# Patient Record
Sex: Female | Born: 1940 | Race: White | Hispanic: No | Smoking: Former smoker
Health system: Southern US, Community
[De-identification: ages and names within clinical notes are randomized; demographics above are authoritative.]

## PROBLEM LIST (undated history)

## (undated) DIAGNOSIS — K219 Gastro-esophageal reflux disease without esophagitis: Secondary | ICD-10-CM

## (undated) DIAGNOSIS — J449 Chronic obstructive pulmonary disease, unspecified: Secondary | ICD-10-CM

## (undated) DIAGNOSIS — R251 Tremor, unspecified: Secondary | ICD-10-CM

## (undated) DIAGNOSIS — F329 Major depressive disorder, single episode, unspecified: Secondary | ICD-10-CM

## (undated) DIAGNOSIS — E079 Disorder of thyroid, unspecified: Secondary | ICD-10-CM

## (undated) DIAGNOSIS — E785 Hyperlipidemia, unspecified: Secondary | ICD-10-CM

## (undated) DIAGNOSIS — F32A Depression, unspecified: Secondary | ICD-10-CM

## (undated) DIAGNOSIS — E039 Hypothyroidism, unspecified: Secondary | ICD-10-CM

## (undated) DIAGNOSIS — I10 Essential (primary) hypertension: Secondary | ICD-10-CM

## (undated) DIAGNOSIS — Q254 Congenital malformation of aorta unspecified: Secondary | ICD-10-CM

## (undated) HISTORY — DX: Chronic obstructive pulmonary disease, unspecified: J44.9

## (undated) HISTORY — PX: BREAST SURGERY: SHX581

## (undated) HISTORY — DX: Essential (primary) hypertension: I10

## (undated) HISTORY — DX: Gastro-esophageal reflux disease without esophagitis: K21.9

## (undated) HISTORY — PX: VARICOSE VEIN SURGERY: SHX832

## (undated) HISTORY — DX: Disorder of thyroid, unspecified: E07.9

## (undated) HISTORY — DX: Hyperlipidemia, unspecified: E78.5

---
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
## 1978-08-10 HISTORY — PX: OTHER SURGICAL HISTORY: SHX169

## 1989-08-10 HISTORY — PX: HERNIA REPAIR: SHX51

## 2004-10-27 ENCOUNTER — Ambulatory Visit: Payer: Self-pay | Admitting: Family Medicine

## 2005-11-13 ENCOUNTER — Emergency Department: Payer: Self-pay | Admitting: Emergency Medicine

## 2006-03-29 ENCOUNTER — Ambulatory Visit: Payer: Self-pay | Admitting: Family Medicine

## 2006-05-31 ENCOUNTER — Inpatient Hospital Stay: Payer: Self-pay | Admitting: Internal Medicine

## 2006-05-31 ENCOUNTER — Other Ambulatory Visit: Payer: Self-pay

## 2007-04-04 ENCOUNTER — Ambulatory Visit: Payer: Self-pay | Admitting: Family Medicine

## 2008-05-24 ENCOUNTER — Ambulatory Visit: Payer: Self-pay | Admitting: Family Medicine

## 2008-08-10 LAB — HM COLONOSCOPY

## 2009-07-01 ENCOUNTER — Ambulatory Visit: Payer: Self-pay | Admitting: Family Medicine

## 2010-08-10 LAB — HM MAMMOGRAPHY: HM Mammogram: NORMAL

## 2010-08-14 ENCOUNTER — Ambulatory Visit: Payer: Self-pay | Admitting: Family Medicine

## 2011-02-08 ENCOUNTER — Ambulatory Visit: Payer: Self-pay | Admitting: Internal Medicine

## 2011-02-17 ENCOUNTER — Ambulatory Visit: Payer: Self-pay | Admitting: Internal Medicine

## 2011-03-11 ENCOUNTER — Ambulatory Visit: Payer: Self-pay | Admitting: Internal Medicine

## 2011-04-08 ENCOUNTER — Ambulatory Visit: Payer: Self-pay | Admitting: Internal Medicine

## 2011-04-11 ENCOUNTER — Ambulatory Visit: Payer: Self-pay | Admitting: Internal Medicine

## 2011-12-25 ENCOUNTER — Ambulatory Visit: Payer: Self-pay

## 2012-01-28 ENCOUNTER — Ambulatory Visit: Payer: Self-pay | Admitting: Cardiology

## 2013-10-14 LAB — TSH: TSH: 0.94 u[IU]/mL (ref <=5.90)

## 2014-06-14 LAB — BASIC METABOLIC PANEL
Creatinine: 0.7 mg/dL (ref <=1.1)
Glucose: 97 mg/dL

## 2014-08-21 DIAGNOSIS — J449 Chronic obstructive pulmonary disease, unspecified: Secondary | ICD-10-CM | POA: Diagnosis not present

## 2014-08-29 DIAGNOSIS — R05 Cough: Secondary | ICD-10-CM | POA: Diagnosis not present

## 2014-08-29 DIAGNOSIS — R0602 Shortness of breath: Secondary | ICD-10-CM | POA: Diagnosis not present

## 2014-08-29 DIAGNOSIS — J449 Chronic obstructive pulmonary disease, unspecified: Secondary | ICD-10-CM | POA: Diagnosis not present

## 2014-08-29 DIAGNOSIS — J3489 Other specified disorders of nose and nasal sinuses: Secondary | ICD-10-CM | POA: Diagnosis not present

## 2014-09-21 DIAGNOSIS — J449 Chronic obstructive pulmonary disease, unspecified: Secondary | ICD-10-CM | POA: Diagnosis not present

## 2014-10-20 DIAGNOSIS — J449 Chronic obstructive pulmonary disease, unspecified: Secondary | ICD-10-CM | POA: Diagnosis not present

## 2014-11-20 DIAGNOSIS — J449 Chronic obstructive pulmonary disease, unspecified: Secondary | ICD-10-CM | POA: Diagnosis not present

## 2014-11-30 DIAGNOSIS — J449 Chronic obstructive pulmonary disease, unspecified: Secondary | ICD-10-CM | POA: Insufficient documentation

## 2014-11-30 DIAGNOSIS — F32A Depression, unspecified: Secondary | ICD-10-CM | POA: Insufficient documentation

## 2014-11-30 DIAGNOSIS — F329 Major depressive disorder, single episode, unspecified: Secondary | ICD-10-CM | POA: Insufficient documentation

## 2014-11-30 DIAGNOSIS — K219 Gastro-esophageal reflux disease without esophagitis: Secondary | ICD-10-CM | POA: Insufficient documentation

## 2014-11-30 DIAGNOSIS — E039 Hypothyroidism, unspecified: Secondary | ICD-10-CM | POA: Insufficient documentation

## 2014-11-30 DIAGNOSIS — I10 Essential (primary) hypertension: Secondary | ICD-10-CM | POA: Insufficient documentation

## 2014-12-18 DIAGNOSIS — E039 Hypothyroidism, unspecified: Secondary | ICD-10-CM | POA: Diagnosis not present

## 2014-12-18 DIAGNOSIS — Z1389 Encounter for screening for other disorder: Secondary | ICD-10-CM | POA: Diagnosis not present

## 2014-12-18 DIAGNOSIS — Z0000 Encounter for general adult medical examination without abnormal findings: Secondary | ICD-10-CM | POA: Diagnosis not present

## 2014-12-18 DIAGNOSIS — F329 Major depressive disorder, single episode, unspecified: Secondary | ICD-10-CM | POA: Diagnosis not present

## 2014-12-20 DIAGNOSIS — J449 Chronic obstructive pulmonary disease, unspecified: Secondary | ICD-10-CM | POA: Diagnosis not present

## 2015-01-15 DIAGNOSIS — I359 Nonrheumatic aortic valve disorder, unspecified: Secondary | ICD-10-CM | POA: Diagnosis not present

## 2015-01-15 DIAGNOSIS — I10 Essential (primary) hypertension: Secondary | ICD-10-CM | POA: Diagnosis not present

## 2015-01-15 DIAGNOSIS — Z8679 Personal history of other diseases of the circulatory system: Secondary | ICD-10-CM | POA: Diagnosis not present

## 2015-01-15 DIAGNOSIS — J42 Unspecified chronic bronchitis: Secondary | ICD-10-CM | POA: Diagnosis not present

## 2015-01-20 DIAGNOSIS — J449 Chronic obstructive pulmonary disease, unspecified: Secondary | ICD-10-CM | POA: Diagnosis not present

## 2015-01-23 DIAGNOSIS — R0609 Other forms of dyspnea: Secondary | ICD-10-CM | POA: Diagnosis not present

## 2015-01-23 DIAGNOSIS — J439 Emphysema, unspecified: Secondary | ICD-10-CM | POA: Diagnosis not present

## 2015-01-23 DIAGNOSIS — R0902 Hypoxemia: Secondary | ICD-10-CM | POA: Diagnosis not present

## 2015-02-13 ENCOUNTER — Other Ambulatory Visit: Payer: Medicare Other

## 2015-02-13 DIAGNOSIS — E039 Hypothyroidism, unspecified: Secondary | ICD-10-CM

## 2015-02-14 ENCOUNTER — Other Ambulatory Visit: Payer: Self-pay

## 2015-02-14 DIAGNOSIS — E039 Hypothyroidism, unspecified: Secondary | ICD-10-CM

## 2015-02-14 LAB — TSH: TSH: 2.24 u[IU]/mL (ref 0.450–4.500)

## 2015-02-14 MED ORDER — LEVOTHYROXINE SODIUM 88 MCG PO TABS
88.0000 ug | ORAL_TABLET | Freq: Every day | ORAL | Status: DC
Start: 2015-02-14 — End: 2015-07-26

## 2015-02-19 DIAGNOSIS — J449 Chronic obstructive pulmonary disease, unspecified: Secondary | ICD-10-CM | POA: Diagnosis not present

## 2015-03-22 DIAGNOSIS — J449 Chronic obstructive pulmonary disease, unspecified: Secondary | ICD-10-CM | POA: Diagnosis not present

## 2015-04-22 DIAGNOSIS — J449 Chronic obstructive pulmonary disease, unspecified: Secondary | ICD-10-CM | POA: Diagnosis not present

## 2015-05-17 ENCOUNTER — Other Ambulatory Visit: Payer: Self-pay

## 2015-06-22 DIAGNOSIS — J449 Chronic obstructive pulmonary disease, unspecified: Secondary | ICD-10-CM | POA: Diagnosis not present

## 2015-06-24 DIAGNOSIS — R0902 Hypoxemia: Secondary | ICD-10-CM | POA: Diagnosis not present

## 2015-06-24 DIAGNOSIS — R0609 Other forms of dyspnea: Secondary | ICD-10-CM | POA: Diagnosis not present

## 2015-06-24 DIAGNOSIS — J439 Emphysema, unspecified: Secondary | ICD-10-CM | POA: Diagnosis not present

## 2015-07-22 DIAGNOSIS — J449 Chronic obstructive pulmonary disease, unspecified: Secondary | ICD-10-CM | POA: Diagnosis not present

## 2015-07-26 ENCOUNTER — Other Ambulatory Visit: Payer: Self-pay

## 2015-07-26 DIAGNOSIS — F32A Depression, unspecified: Secondary | ICD-10-CM

## 2015-07-26 DIAGNOSIS — F329 Major depressive disorder, single episode, unspecified: Secondary | ICD-10-CM

## 2015-07-26 DIAGNOSIS — E039 Hypothyroidism, unspecified: Secondary | ICD-10-CM

## 2015-07-26 DIAGNOSIS — K219 Gastro-esophageal reflux disease without esophagitis: Secondary | ICD-10-CM

## 2015-07-26 MED ORDER — LEVOTHYROXINE SODIUM 88 MCG PO TABS: 88.0000 ug | ORAL_TABLET | Freq: Every day | ORAL | Status: DC

## 2015-07-26 MED ORDER — OMEPRAZOLE 20 MG PO CPDR: 20.0000 mg | DELAYED_RELEASE_CAPSULE | Freq: Every day | ORAL | Status: DC

## 2015-07-26 MED ORDER — CITALOPRAM HYDROBROMIDE 20 MG PO TABS: 20.0000 mg | ORAL_TABLET | Freq: Every day | ORAL | Status: DC

## 2015-07-29 ENCOUNTER — Other Ambulatory Visit: Payer: Self-pay

## 2015-07-30 DIAGNOSIS — J439 Emphysema, unspecified: Secondary | ICD-10-CM | POA: Diagnosis not present

## 2015-07-30 DIAGNOSIS — K219 Gastro-esophageal reflux disease without esophagitis: Secondary | ICD-10-CM | POA: Diagnosis not present

## 2015-07-30 DIAGNOSIS — I10 Essential (primary) hypertension: Secondary | ICD-10-CM | POA: Diagnosis not present

## 2015-07-30 DIAGNOSIS — I359 Nonrheumatic aortic valve disorder, unspecified: Secondary | ICD-10-CM | POA: Diagnosis not present

## 2015-07-30 DIAGNOSIS — R0602 Shortness of breath: Secondary | ICD-10-CM | POA: Diagnosis not present

## 2015-08-01 ENCOUNTER — Encounter: Payer: Self-pay | Admitting: Family Medicine

## 2015-08-01 ENCOUNTER — Ambulatory Visit (INDEPENDENT_AMBULATORY_CARE_PROVIDER_SITE_OTHER): Payer: Medicare Other | Admitting: Family Medicine

## 2015-08-01 VITALS — BP 120/70 | HR 80 | Ht 64.0 in | Wt 115.0 lb

## 2015-08-01 DIAGNOSIS — K219 Gastro-esophageal reflux disease without esophagitis: Secondary | ICD-10-CM

## 2015-08-01 DIAGNOSIS — F329 Major depressive disorder, single episode, unspecified: Secondary | ICD-10-CM | POA: Diagnosis not present

## 2015-08-01 DIAGNOSIS — E039 Hypothyroidism, unspecified: Secondary | ICD-10-CM

## 2015-08-01 DIAGNOSIS — F32A Depression, unspecified: Secondary | ICD-10-CM

## 2015-08-01 MED ORDER — OMEPRAZOLE 20 MG PO CPDR: 20.0000 mg | DELAYED_RELEASE_CAPSULE | Freq: Every day | ORAL | Status: DC

## 2015-08-01 MED ORDER — LEVOTHYROXINE SODIUM 88 MCG PO TABS: 88.0000 ug | ORAL_TABLET | Freq: Every day | ORAL | Status: DC

## 2015-08-01 MED ORDER — CITALOPRAM HYDROBROMIDE 20 MG PO TABS: 20.0000 mg | ORAL_TABLET | Freq: Every day | ORAL | Status: DC

## 2015-08-01 NOTE — Progress Notes (Signed)
Name: Tasha Howe   MRN: [id number]    DOB: 1941/02/14   Date:08/01/2015       Progress Note  Subjective  Chief Complaint  Chief Complaint  Patient presents with  . Hypothyroidism  . Depression  . Gastroesophageal Reflux    Depression        This is a chronic problem.  The current episode started more than 1 year ago.   The onset quality is gradual.   The problem occurs intermittently.  The problem has been waxing and waning since onset.  Associated symptoms include no decreased concentration, no fatigue, no helplessness, no hopelessness, does not have insomnia, not irritable, no restlessness, no decreased interest, no appetite change, no body aches, no myalgias, no headaches, no indigestion, not sad and no suicidal ideas.     The symptoms are aggravated by nothing.  Past treatments include SSRIs - Selective serotonin reuptake inhibitors.  Compliance with treatment is variable and good.  Past medical history includes thyroid problem.   Gastroesophageal Reflux She reports no abdominal pain, no chest pain, no coughing, no dysphagia, no heartburn, no nausea, no sore throat or no wheezing. This is a recurrent problem. The current episode started more than 1 year ago. The problem has been gradually improving. The symptoms are aggravated by certain foods. Pertinent negatives include no fatigue, melena or weight loss. She has tried a PPI for the symptoms. The treatment provided mild relief.  Thyroid Problem Presents for follow-up visit. Patient reports no anxiety, cold intolerance, constipation, depressed mood, diarrhea, fatigue, palpitations, weight gain or weight loss. The symptoms have been stable. Past treatments include levothyroxine. There is no history of atrial fibrillation.    No problem-specific assessment & plan notes found for this encounter.   Past Medical History  Diagnosis Date  . Thyroid disease   . Hypertension   . Hyperlipidemia   . COPD (chronic obstructive pulmonary  disease) (HCC)   . GERD (gastroesophageal reflux disease)     Past Surgical History  Procedure Laterality Date  . Hernia repair  08/10/1989  . Vascular stent  08/10/1978  . Vaginal hysterectomy  08/10/1958    History reviewed. No pertinent family history.  Social History   Social History  . Marital Status: Divorced    Spouse Name: N/A  . Number of Children: N/A  . Years of Education: N/A   Occupational History  . Not on file.   Social History Main Topics  . Smoking status: Former Games developer  . Smokeless tobacco: Not on file  . Alcohol Use: No  . Drug Use: No  . Sexual Activity: Not Currently   Other Topics Concern  . Not on file   Social History Narrative    Allergies  Allergen Reactions  . Sulfa Antibiotics      Review of Systems  Constitutional: Negative for fever, chills, weight loss, weight gain, malaise/fatigue, appetite change and fatigue.  HENT: Negative for ear discharge, ear pain and sore throat.   Eyes: Negative for blurred vision.  Respiratory: Negative for cough, sputum production, shortness of breath and wheezing.   Cardiovascular: Negative for chest pain, palpitations and leg swelling.  Gastrointestinal: Negative for heartburn, dysphagia, nausea, abdominal pain, diarrhea, constipation, blood in stool and melena.  Genitourinary: Negative for dysuria, urgency, frequency and hematuria.  Musculoskeletal: Negative for myalgias, back pain, joint pain and neck pain.  Skin: Negative for rash.  Neurological: Negative for dizziness, tingling, sensory change, focal weakness and headaches.  Endo/Heme/Allergies: Negative for environmental allergies, cold  intolerance and polydipsia. Does not bruise/bleed easily.  Psychiatric/Behavioral: Positive for depression. Negative for suicidal ideas and decreased concentration. The patient is not nervous/anxious and does not have insomnia.      Objective  Filed Vitals:   08/01/15 1349  BP: 120/70  Pulse: 80  Height: 5\' 4"   (1.626 m)  Weight: 115 lb (52.164 kg)    Physical Exam  Constitutional: She is well-developed, well-nourished, and in no distress. She is not irritable. No distress.  HENT:  Head: Normocephalic and atraumatic.  Right Ear: External ear normal.  Left Ear: External ear normal.  Nose: Nose normal.  Mouth/Throat: Oropharynx is clear and moist.  Eyes: Conjunctivae and EOM are normal. Pupils are equal, round, and reactive to light. Right eye exhibits no discharge. Left eye exhibits no discharge.  Neck: Normal range of motion. Neck supple. No JVD present. No thyromegaly present.  Cardiovascular: Normal rate, regular rhythm, normal heart sounds and intact distal pulses.  Exam reveals no gallop and no friction rub.   No murmur heard. Pulmonary/Chest: Effort normal and breath sounds normal.  Abdominal: Soft. Bowel sounds are normal. She exhibits no mass. There is no tenderness. There is no guarding.  Musculoskeletal: Normal range of motion. She exhibits no edema.  Lymphadenopathy:    She has no cervical adenopathy.  Neurological: She is alert.  Skin: Skin is warm and dry. She is not diaphoretic.  Psychiatric: Mood and affect normal.      Assessment & Plan  Problem List Items Addressed This Visit      Digestive   Gastroesophageal reflux disease - Primary   Relevant Medications   omeprazole (PRILOSEC) 20 MG capsule     Endocrine   Hypothyroidism (acquired)   Relevant Medications   levothyroxine (SYNTHROID, LEVOTHROID) 88 MCG tablet     Other   Depression   Relevant Medications   citalopram (CELEXA) 20 MG tablet    Other Visit Diagnoses    GERD without esophagitis        Relevant Medications    omeprazole (PRILOSEC) 20 MG capsule    Hypothyroidism, unspecified hypothyroidism type        Relevant Medications    levothyroxine (SYNTHROID, LEVOTHROID) 88 MCG tablet    Other Relevant Orders    TSH         Dr. Hayden Rasmusseneanna Rhilynn Preyer [facility]  08/01/2015

## 2015-08-02 LAB — TSH: TSH: 1.79 u[IU]/mL (ref 0.450–4.500)

## 2015-08-15 DIAGNOSIS — R0602 Shortness of breath: Secondary | ICD-10-CM | POA: Diagnosis not present

## 2015-08-20 DIAGNOSIS — I359 Nonrheumatic aortic valve disorder, unspecified: Secondary | ICD-10-CM | POA: Diagnosis not present

## 2015-08-20 DIAGNOSIS — I10 Essential (primary) hypertension: Secondary | ICD-10-CM | POA: Diagnosis not present

## 2015-08-22 DIAGNOSIS — J449 Chronic obstructive pulmonary disease, unspecified: Secondary | ICD-10-CM | POA: Diagnosis not present

## 2015-09-22 DIAGNOSIS — J449 Chronic obstructive pulmonary disease, unspecified: Secondary | ICD-10-CM | POA: Diagnosis not present

## 2015-10-20 DIAGNOSIS — J449 Chronic obstructive pulmonary disease, unspecified: Secondary | ICD-10-CM | POA: Diagnosis not present

## 2015-10-22 ENCOUNTER — Other Ambulatory Visit: Payer: Self-pay

## 2015-10-30 DIAGNOSIS — J449 Chronic obstructive pulmonary disease, unspecified: Secondary | ICD-10-CM | POA: Diagnosis not present

## 2015-10-30 DIAGNOSIS — R0902 Hypoxemia: Secondary | ICD-10-CM | POA: Diagnosis not present

## 2015-11-20 DIAGNOSIS — J449 Chronic obstructive pulmonary disease, unspecified: Secondary | ICD-10-CM | POA: Diagnosis not present

## 2015-12-20 DIAGNOSIS — J449 Chronic obstructive pulmonary disease, unspecified: Secondary | ICD-10-CM | POA: Diagnosis not present

## 2016-01-20 DIAGNOSIS — J449 Chronic obstructive pulmonary disease, unspecified: Secondary | ICD-10-CM | POA: Diagnosis not present

## 2016-02-19 DIAGNOSIS — J449 Chronic obstructive pulmonary disease, unspecified: Secondary | ICD-10-CM | POA: Diagnosis not present

## 2016-02-20 DIAGNOSIS — I359 Nonrheumatic aortic valve disorder, unspecified: Secondary | ICD-10-CM | POA: Diagnosis not present

## 2016-02-20 DIAGNOSIS — I10 Essential (primary) hypertension: Secondary | ICD-10-CM | POA: Diagnosis not present

## 2016-02-20 DIAGNOSIS — J439 Emphysema, unspecified: Secondary | ICD-10-CM | POA: Diagnosis not present

## 2016-03-21 DIAGNOSIS — J449 Chronic obstructive pulmonary disease, unspecified: Secondary | ICD-10-CM | POA: Diagnosis not present

## 2016-04-09 ENCOUNTER — Other Ambulatory Visit: Payer: Self-pay

## 2016-04-21 DIAGNOSIS — J449 Chronic obstructive pulmonary disease, unspecified: Secondary | ICD-10-CM | POA: Diagnosis not present

## 2016-04-28 ENCOUNTER — Ambulatory Visit (INDEPENDENT_AMBULATORY_CARE_PROVIDER_SITE_OTHER): Payer: Medicare Other | Admitting: Family Medicine

## 2016-04-28 ENCOUNTER — Encounter: Payer: Self-pay | Admitting: Family Medicine

## 2016-04-28 ENCOUNTER — Ambulatory Visit: Payer: Medicare Other | Admitting: Family Medicine

## 2016-04-28 VITALS — BP 128/76 | HR 79 | Ht 65.0 in | Wt 113.0 lb

## 2016-04-28 DIAGNOSIS — R110 Nausea: Secondary | ICD-10-CM | POA: Diagnosis not present

## 2016-04-28 DIAGNOSIS — E785 Hyperlipidemia, unspecified: Secondary | ICD-10-CM

## 2016-04-28 DIAGNOSIS — I10 Essential (primary) hypertension: Secondary | ICD-10-CM | POA: Diagnosis not present

## 2016-04-28 DIAGNOSIS — Z23 Encounter for immunization: Secondary | ICD-10-CM | POA: Diagnosis not present

## 2016-04-28 DIAGNOSIS — F32A Depression, unspecified: Secondary | ICD-10-CM

## 2016-04-28 DIAGNOSIS — K219 Gastro-esophageal reflux disease without esophagitis: Secondary | ICD-10-CM

## 2016-04-28 DIAGNOSIS — F329 Major depressive disorder, single episode, unspecified: Secondary | ICD-10-CM | POA: Diagnosis not present

## 2016-04-28 DIAGNOSIS — E039 Hypothyroidism, unspecified: Secondary | ICD-10-CM | POA: Diagnosis not present

## 2016-04-28 MED ORDER — LISINOPRIL 5 MG PO TABS: 5.0000 mg | ORAL_TABLET | Freq: Every day | ORAL | 1 refills | Status: DC

## 2016-04-28 MED ORDER — LEVOTHYROXINE SODIUM 88 MCG PO TABS: 88.0000 ug | ORAL_TABLET | Freq: Every day | ORAL | 1 refills | Status: DC

## 2016-04-28 MED ORDER — ONDANSETRON HCL 8 MG PO TABS: 8.0000 mg | ORAL_TABLET | Freq: Three times a day (TID) | ORAL | 1 refills | Status: DC | PRN

## 2016-04-28 MED ORDER — PANTOPRAZOLE SODIUM 40 MG PO TBEC: 40.0000 mg | DELAYED_RELEASE_TABLET | Freq: Every day | ORAL | 6 refills | Status: DC

## 2016-04-28 MED ORDER — FISH OIL 1000 MG PO CPDR: 3.0000 | DELAYED_RELEASE_CAPSULE | Freq: Every day | ORAL | 3 refills | Status: DC

## 2016-04-28 MED ORDER — CITALOPRAM HYDROBROMIDE 20 MG PO TABS: 20.0000 mg | ORAL_TABLET | Freq: Every day | ORAL | 1 refills | Status: DC

## 2016-04-28 NOTE — Progress Notes (Signed)
Name: Tasha Howe   MRN: [id number]    DOB: May 25, 1941   Date:04/28/2016       Progress Note  Subjective  Chief Complaint  Chief Complaint  Patient presents with  . Gastroesophageal Reflux  . Depression  . Hypertension  . Hypothyroidism    Gastroesophageal Reflux  She reports no abdominal pain, no belching, no chest pain, no choking, no coughing, no dysphagia, no early satiety, no globus sensation, no heartburn, no hoarse voice, no nausea, no sore throat, no stridor, no tooth decay, no water brash or no wheezing. This is a chronic problem. The current episode started more than 1 year ago. The problem occurs frequently. The problem has been unchanged. Nothing aggravates the symptoms. Pertinent negatives include no anemia, fatigue, melena, muscle weakness, orthopnea or weight loss. She has tried a PPI for the symptoms. The treatment provided mild relief. Past procedures do not include an abdominal ultrasound, an EGD, esophageal manometry, esophageal pH monitoring, H. pylori antibody titer or a UGI.  Depression         This is a chronic problem.  The current episode started more than 1 year ago.   The onset quality is gradual.   The problem occurs daily.  The problem has been gradually improving since onset.  Associated symptoms include no decreased concentration, no fatigue, no helplessness, no hopelessness, does not have insomnia, not irritable, no restlessness, no decreased interest, no appetite change, no body aches, no myalgias, no headaches, no indigestion, not sad and no suicidal ideas.     The symptoms are aggravated by nothing.  Past treatments include SSRIs - Selective serotonin reuptake inhibitors.  Compliance with treatment is good.  Previous treatment provided mild relief.  Past medical history includes thyroid problem.     Pertinent negatives include no chronic fatigue syndrome, no hypothyroidism, no dementia, no anxiety and no head trauma. Hypertension  This is a chronic  problem. The current episode started more than 1 year ago. The problem is unchanged. The problem is uncontrolled. Pertinent negatives include no anxiety, blurred vision, chest pain, headaches, malaise/fatigue, neck pain, orthopnea, palpitations, peripheral edema, PND, shortness of breath or sweats. There are no associated agents to hypertension. Past treatments include ACE inhibitors. There are no compliance problems.  Hypertensive end-organ damage includes a thyroid problem. There is no history of angina, kidney disease, CAD/MI, CVA, heart failure, left ventricular hypertrophy, PVD, renovascular disease or retinopathy. There is no history of chronic renal disease or a hypertension causing med.  Thyroid Problem  Presents for follow-up visit. Patient reports no anxiety, cold intolerance, constipation, depressed mood, diaphoresis, diarrhea, fatigue, hair loss, heat intolerance, hoarse voice, leg swelling, palpitations, tremors, visual change, weight gain or weight loss. The symptoms have been stable. There is no history of heart failure.    No problem-specific Assessment & Plan notes found for this encounter.   Past Medical History:  Diagnosis Date  . COPD (chronic obstructive pulmonary disease) (HCC)   . GERD (gastroesophageal reflux disease)   . Hyperlipidemia   . Hypertension   . Thyroid disease     Past Surgical History:  Procedure Laterality Date  . HERNIA REPAIR  08/10/1989  . VAGINAL HYSTERECTOMY  08/10/1958  . vascular stent  08/10/1978    History reviewed. No pertinent family history.  Social History   Social History  . Marital status: Divorced    Spouse name: N/A  . Number of children: N/A  . Years of education: N/A   Occupational History  .  Not on file.   Social History Main Topics  . Smoking status: Former Games developer  . Smokeless tobacco: Not on file  . Alcohol use No  . Drug use: No  . Sexual activity: Not Currently   Other Topics Concern  . Not on file   Social  History Narrative  . No narrative on file    Allergies  Allergen Reactions  . Sulfa Antibiotics      Review of Systems  Constitutional: Negative for appetite change, chills, diaphoresis, fatigue, fever, malaise/fatigue, weight gain and weight loss.  HENT: Negative for ear discharge, ear pain, hoarse voice and sore throat.   Eyes: Negative for blurred vision.  Respiratory: Negative for cough, sputum production, choking, shortness of breath and wheezing.   Cardiovascular: Negative for chest pain, palpitations, orthopnea, leg swelling and PND.  Gastrointestinal: Negative for abdominal pain, blood in stool, constipation, diarrhea, dysphagia, heartburn, melena and nausea.  Genitourinary: Negative for dysuria, frequency, hematuria and urgency.  Musculoskeletal: Negative for back pain, joint pain, myalgias, muscle weakness and neck pain.  Skin: Negative for rash.  Neurological: Negative for dizziness, tingling, tremors, sensory change, focal weakness and headaches.  Endo/Heme/Allergies: Negative for environmental allergies, cold intolerance, heat intolerance and polydipsia. Does not bruise/bleed easily.  Psychiatric/Behavioral: Positive for depression. Negative for decreased concentration and suicidal ideas. The patient is not nervous/anxious and does not have insomnia.      Objective  Vitals:   04/28/16 0928  BP: 128/76  Pulse: 79  Weight: 113 lb (51.3 kg)  Height: 5\' 5"  (1.651 m)    Physical Exam  Constitutional: She is well-developed, well-nourished, and in no distress. She is not irritable. No distress.  HENT:  Head: Normocephalic and atraumatic.  Right Ear: External ear normal.  Left Ear: External ear normal.  Nose: Nose normal.  Mouth/Throat: Oropharynx is clear and moist.  Eyes: Conjunctivae and EOM are normal. Pupils are equal, round, and reactive to light. Right eye exhibits no discharge. Left eye exhibits no discharge.  Neck: Normal range of motion. Neck supple. No JVD  present. No thyromegaly present.  Cardiovascular: Normal rate, regular rhythm, normal heart sounds and intact distal pulses.  Exam reveals no gallop and no friction rub.   No murmur heard. Pulmonary/Chest: Effort normal and breath sounds normal. She has no wheezes. She has no rales.  Abdominal: Soft. Bowel sounds are normal. She exhibits no mass. There is no tenderness. There is no guarding.  Musculoskeletal: Normal range of motion. She exhibits no edema.  Lymphadenopathy:    She has no cervical adenopathy.  Neurological: She is alert. She has normal reflexes.  Skin: Skin is warm and dry. She is not diaphoretic.  Psychiatric: Mood and affect normal.  Nursing note and vitals reviewed.     Assessment & Plan  Problem List Items Addressed This Visit      Cardiovascular and Mediastinum   Hypertension - Primary   Relevant Medications   lisinopril (PRINIVIL,ZESTRIL) 5 MG tablet   Other Relevant Orders   Renal Function Panel     Digestive   Gastroesophageal reflux disease   Relevant Medications   pantoprazole (PROTONIX) 40 MG tablet   ondansetron (ZOFRAN) 8 MG tablet     Endocrine   Hypothyroidism (acquired)   Relevant Medications   levothyroxine (SYNTHROID, LEVOTHROID) 88 MCG tablet   Other Relevant Orders   TSH     Other   Depression   Relevant Medications   citalopram (CELEXA) 20 MG tablet   Hyperlipemia   Relevant  Medications   lisinopril (PRINIVIL,ZESTRIL) 5 MG tablet   Omega-3 Fatty Acids (FISH OIL) 1000 MG CPDR   Other Relevant Orders   Lipid Profile    Other Visit Diagnoses    Hypothyroidism, unspecified hypothyroidism type       Relevant Medications   levothyroxine (SYNTHROID, LEVOTHROID) 88 MCG tablet   Nausea       Relevant Medications   ondansetron (ZOFRAN) 8 MG tablet   Flu vaccine need       Relevant Orders   Flu Vaccine QUAD 36+ mos PF IM (Fluarix & Fluzone Quad PF) (Completed)        Dr. Hayden [facility]  04/28/16

## 2016-04-29 LAB — LIPID PANEL
CHOL/HDL RATIO: 3 ratio (ref 0.0–4.4)
CHOLESTEROL TOTAL: 201 mg/dL — AB (ref 100–199)
HDL: 68 mg/dL (ref >39)
LDL CALC: 118 mg/dL — AB (ref 0–99)
TRIGLYCERIDES: 73 mg/dL (ref 0–149)
VLDL CHOLESTEROL CAL: 15 mg/dL (ref 5–40)

## 2016-04-29 LAB — RENAL FUNCTION PANEL
Albumin: 4.7 g/dL (ref 3.5–4.8)
BUN/Creatinine Ratio: 25 (ref 12–28)
BUN: 16 mg/dL (ref 8–27)
CALCIUM: 9.6 mg/dL (ref 8.7–10.3)
CO2: 26 mmol/L (ref 18–29)
CREATININE: 0.63 mg/dL (ref 0.57–1.00)
Chloride: 96 mmol/L (ref 96–106)
GFR calc Af Amer: 101 mL/min/{1.73_m2} (ref >59)
GFR, EST NON AFRICAN AMERICAN: 88 mL/min/{1.73_m2} (ref >59)
Glucose: 87 mg/dL (ref 65–99)
PHOSPHORUS: 3.3 mg/dL (ref 2.5–4.5)
Potassium: 4.2 mmol/L (ref 3.5–5.2)
Sodium: 140 mmol/L (ref 134–144)

## 2016-04-29 LAB — TSH: TSH: 0.528 u[IU]/mL (ref 0.450–4.500)

## 2016-05-05 DIAGNOSIS — J439 Emphysema, unspecified: Secondary | ICD-10-CM | POA: Diagnosis not present

## 2016-05-05 DIAGNOSIS — R0609 Other forms of dyspnea: Secondary | ICD-10-CM | POA: Diagnosis not present

## 2016-05-05 DIAGNOSIS — R0902 Hypoxemia: Secondary | ICD-10-CM | POA: Diagnosis not present

## 2016-05-21 DIAGNOSIS — J449 Chronic obstructive pulmonary disease, unspecified: Secondary | ICD-10-CM | POA: Diagnosis not present

## 2016-05-25 ENCOUNTER — Other Ambulatory Visit: Payer: Self-pay

## 2016-05-29 ENCOUNTER — Other Ambulatory Visit: Payer: Self-pay

## 2016-06-01 ENCOUNTER — Ambulatory Visit: Payer: Medicare Other | Admitting: Family Medicine

## 2016-06-01 ENCOUNTER — Other Ambulatory Visit: Payer: Self-pay

## 2016-06-01 MED ORDER — OMEPRAZOLE 40 MG PO CPDR: 40.0000 mg | DELAYED_RELEASE_CAPSULE | Freq: Every day | ORAL | 3 refills | Status: DC

## 2016-06-05 ENCOUNTER — Emergency Department: Payer: Medicare Other

## 2016-06-05 ENCOUNTER — Inpatient Hospital Stay
Admission: EM | Admit: 2016-06-05 | Discharge: 2016-06-07 | DRG: 190 | Disposition: A | Discharge status: Home / Self Care | Payer: Medicare Other | Attending: Internal Medicine | Admitting: Internal Medicine

## 2016-06-05 DIAGNOSIS — J440 Chronic obstructive pulmonary disease with acute lower respiratory infection: Secondary | ICD-10-CM | POA: Diagnosis present

## 2016-06-05 DIAGNOSIS — E039 Hypothyroidism, unspecified: Secondary | ICD-10-CM | POA: Diagnosis present

## 2016-06-05 DIAGNOSIS — K219 Gastro-esophageal reflux disease without esophagitis: Secondary | ICD-10-CM | POA: Diagnosis not present

## 2016-06-05 DIAGNOSIS — J441 Chronic obstructive pulmonary disease with (acute) exacerbation: Principal | ICD-10-CM | POA: Diagnosis present

## 2016-06-05 DIAGNOSIS — E785 Hyperlipidemia, unspecified: Secondary | ICD-10-CM | POA: Diagnosis present

## 2016-06-05 DIAGNOSIS — R0602 Shortness of breath: Secondary | ICD-10-CM | POA: Diagnosis not present

## 2016-06-05 DIAGNOSIS — J209 Acute bronchitis, unspecified: Secondary | ICD-10-CM | POA: Diagnosis present

## 2016-06-05 DIAGNOSIS — Z7951 Long term (current) use of inhaled steroids: Secondary | ICD-10-CM

## 2016-06-05 DIAGNOSIS — I10 Essential (primary) hypertension: Secondary | ICD-10-CM | POA: Diagnosis present

## 2016-06-05 DIAGNOSIS — J9601 Acute respiratory failure with hypoxia: Secondary | ICD-10-CM

## 2016-06-05 DIAGNOSIS — Z87891 Personal history of nicotine dependence: Secondary | ICD-10-CM

## 2016-06-05 LAB — CBC WITH DIFFERENTIAL/PLATELET
BASOS ABS: 0.1 10*3/uL (ref 0–0.1)
BASOS PCT: 1 %
EOS ABS: 0.2 10*3/uL (ref 0–0.7)
EOS PCT: 1 %
HEMATOCRIT: 45.8 % (ref 35.0–47.0)
Hemoglobin: 15.4 g/dL (ref 12.0–16.0)
Lymphocytes Relative: 12 %
Lymphs Abs: 1.9 10*3/uL (ref 1.0–3.6)
MCH: 31.5 pg (ref 26.0–34.0)
MCHC: 33.7 g/dL (ref 32.0–36.0)
MCV: 93.5 fL (ref 80.0–100.0)
MONO ABS: 0.9 10*3/uL (ref 0.2–0.9)
MONOS PCT: 6 %
Neutro Abs: 12.3 10*3/uL — ABNORMAL HIGH (ref 1.4–6.5)
Neutrophils Relative %: 80 %
PLATELETS: 200 10*3/uL (ref 150–440)
RBC: 4.9 MIL/uL (ref 3.80–5.20)
RDW: 14.6 % — AB (ref 11.5–14.5)
WBC: 15.4 10*3/uL — ABNORMAL HIGH (ref 3.6–11.0)

## 2016-06-05 LAB — COMPREHENSIVE METABOLIC PANEL
ALBUMIN: 4.5 g/dL (ref 3.5–5.0)
ALT: 17 U/L (ref 14–54)
ANION GAP: 10 (ref 5–15)
AST: 28 U/L (ref 15–41)
Alkaline Phosphatase: 51 U/L (ref 38–126)
BILIRUBIN TOTAL: 0.6 mg/dL (ref 0.3–1.2)
BUN: 19 mg/dL (ref 6–20)
CHLORIDE: 99 mmol/L — AB (ref 101–111)
CO2: 28 mmol/L (ref 22–32)
Calcium: 9.4 mg/dL (ref 8.9–10.3)
Creatinine, Ser: 0.62 mg/dL (ref 0.44–1.00)
GFR calc Af Amer: >60 mL/min (ref >60)
GFR calc non Af Amer: >60 mL/min (ref >60)
GLUCOSE: 111 mg/dL — AB (ref 65–99)
POTASSIUM: 3.8 mmol/L (ref 3.5–5.1)
SODIUM: 137 mmol/L (ref 135–145)
TOTAL PROTEIN: 7.6 g/dL (ref 6.5–8.1)

## 2016-06-05 LAB — INFLUENZA PANEL BY PCR (TYPE A & B)
H1N1FLUPCR: NOT DETECTED
Influenza A By PCR: NEGATIVE
Influenza B By PCR: NEGATIVE

## 2016-06-05 LAB — TROPONIN I: Troponin I: <0.03 ng/mL (ref <0.03)

## 2016-06-05 LAB — MAGNESIUM: Magnesium: 2.2 mg/dL (ref 1.7–2.4)

## 2016-06-05 MED ORDER — CEFTRIAXONE SODIUM-DEXTROSE 1-3.74 GM-% IV SOLR
1.0000 g | INTRAVENOUS | Status: DC
  Administered 2016-06-06: 1 g via INTRAVENOUS
  Filled 2016-06-05: qty 50

## 2016-06-05 MED ORDER — ONDANSETRON HCL 4 MG/2ML IJ SOLN: 4.0000 mg | Freq: Four times a day (QID) | INTRAMUSCULAR | Status: DC | PRN

## 2016-06-05 MED ORDER — ASPIRIN 81 MG PO CHEW: 81.0000 mg | CHEWABLE_TABLET | Freq: Every day | ORAL | Status: DC

## 2016-06-05 MED ORDER — LEVOTHYROXINE SODIUM 88 MCG PO TABS: 88.0000 ug | ORAL_TABLET | Freq: Every day | ORAL | Status: DC

## 2016-06-05 MED ORDER — TIOTROPIUM BROMIDE MONOHYDRATE 18 MCG IN CAPS
1.0000 | ORAL_CAPSULE | Freq: Every day | RESPIRATORY_TRACT | Status: DC
  Administered 2016-06-06 – 2016-06-07 (×2): 18 ug via RESPIRATORY_TRACT
  Filled 2016-06-05: qty 5

## 2016-06-05 MED ORDER — LISINOPRIL 5 MG PO TABS
5.0000 mg | ORAL_TABLET | Freq: Every day | ORAL | Status: DC
  Administered 2016-06-05 – 2016-06-07 (×3): 5 mg via ORAL
  Filled 2016-06-05 (×3): qty 1

## 2016-06-05 MED ORDER — IPRATROPIUM-ALBUTEROL 0.5-2.5 (3) MG/3ML IN SOLN
3.0000 mL | Freq: Once | RESPIRATORY_TRACT | Status: AC
  Administered 2016-06-05: 3 mL via RESPIRATORY_TRACT
  Filled 2016-06-05: qty 3

## 2016-06-05 MED ORDER — OMEGA-3-ACID ETHYL ESTERS 1 G PO CAPS: 1.0000 g | ORAL_CAPSULE | Freq: Two times a day (BID) | ORAL | Status: DC

## 2016-06-05 MED ORDER — LISINOPRIL 5 MG PO TABS: 5.0000 mg | ORAL_TABLET | Freq: Every day | ORAL | Status: DC

## 2016-06-05 MED ORDER — DEXTROSE 5 % IV SOLN
500.0000 mg | Freq: Once | INTRAVENOUS | Status: AC
  Administered 2016-06-05: 500 mg via INTRAVENOUS
  Filled 2016-06-05: qty 500

## 2016-06-05 MED ORDER — OMEGA-3-ACID ETHYL ESTERS 1 G PO CAPS
1.0000 g | ORAL_CAPSULE | Freq: Two times a day (BID) | ORAL | Status: DC
  Administered 2016-06-05 – 2016-06-07 (×5): 1 g via ORAL
  Filled 2016-06-05 (×5): qty 1

## 2016-06-05 MED ORDER — PANTOPRAZOLE SODIUM 40 MG PO TBEC
40.0000 mg | DELAYED_RELEASE_TABLET | Freq: Every day | ORAL | Status: DC
  Administered 2016-06-05 – 2016-06-07 (×3): 40 mg via ORAL
  Filled 2016-06-05 (×3): qty 1

## 2016-06-05 MED ORDER — CEFTRIAXONE SODIUM 1 G IJ SOLR: 1.0000 g | Freq: Once | INTRAMUSCULAR | Status: DC

## 2016-06-05 MED ORDER — CITALOPRAM HYDROBROMIDE 20 MG PO TABS
20.0000 mg | ORAL_TABLET | Freq: Every day | ORAL | Status: DC
  Administered 2016-06-05 – 2016-06-07 (×3): 20 mg via ORAL
  Filled 2016-06-05 (×3): qty 1

## 2016-06-05 MED ORDER — MAGNESIUM GLUCONATE 500 (27 MG) MG PO TABS
500.0000 mg | ORAL_TABLET | Freq: Every day | ORAL | Status: DC
  Administered 2016-06-05 – 2016-06-07 (×3): 500 mg via ORAL
  Filled 2016-06-05 (×3): qty 1

## 2016-06-05 MED ORDER — ALBUTEROL SULFATE (2.5 MG/3ML) 0.083% IN NEBU
5.0000 mg | INHALATION_SOLUTION | Freq: Once | RESPIRATORY_TRACT | Status: AC
  Administered 2016-06-05: 5 mg via RESPIRATORY_TRACT
  Filled 2016-06-05: qty 6

## 2016-06-05 MED ORDER — SODIUM CHLORIDE 0.9% FLUSH: 3.0000 mL | INTRAVENOUS | Status: DC | PRN

## 2016-06-05 MED ORDER — PANTOPRAZOLE SODIUM 40 MG PO TBEC: 40.0000 mg | DELAYED_RELEASE_TABLET | Freq: Every day | ORAL | Status: DC

## 2016-06-05 MED ORDER — SENNOSIDES-DOCUSATE SODIUM 8.6-50 MG PO TABS: 1.0000 | ORAL_TABLET | Freq: Every day | ORAL | Status: DC

## 2016-06-05 MED ORDER — ONDANSETRON HCL 4 MG PO TABS: 4.0000 mg | ORAL_TABLET | Freq: Four times a day (QID) | ORAL | Status: DC | PRN

## 2016-06-05 MED ORDER — LEVOTHYROXINE SODIUM 88 MCG PO TABS
88.0000 ug | ORAL_TABLET | Freq: Every day | ORAL | Status: DC
  Administered 2016-06-05 – 2016-06-07 (×3): 88 ug via ORAL
  Filled 2016-06-05 (×3): qty 1

## 2016-06-05 MED ORDER — MAGNESIUM GLUCONATE 500 (27 MG) MG PO TABS: 500.0000 mg | ORAL_TABLET | Freq: Every day | ORAL | Status: DC

## 2016-06-05 MED ORDER — METHYLPREDNISOLONE SODIUM SUCC 125 MG IJ SOLR
125.0000 mg | Freq: Once | INTRAMUSCULAR | Status: AC
  Administered 2016-06-05: 125 mg via INTRAVENOUS
  Filled 2016-06-05: qty 2

## 2016-06-05 MED ORDER — ACETAMINOPHEN 650 MG RE SUPP: 650.0000 mg | Freq: Four times a day (QID) | RECTAL | Status: DC | PRN

## 2016-06-05 MED ORDER — SODIUM CHLORIDE 0.9 % IV SOLN: 250.0000 mL | INTRAVENOUS | Status: DC | PRN

## 2016-06-05 MED ORDER — ASPIRIN 81 MG PO CHEW
81.0000 mg | CHEWABLE_TABLET | Freq: Every day | ORAL | Status: DC
  Administered 2016-06-05 – 2016-06-07 (×3): 81 mg via ORAL
  Filled 2016-06-05 (×3): qty 1

## 2016-06-05 MED ORDER — METHYLPREDNISOLONE SODIUM SUCC 125 MG IJ SOLR
60.0000 mg | Freq: Four times a day (QID) | INTRAMUSCULAR | Status: DC
  Administered 2016-06-05 – 2016-06-06 (×4): 60 mg via INTRAVENOUS
  Filled 2016-06-05 (×4): qty 2

## 2016-06-05 MED ORDER — GUAIFENESIN ER 600 MG PO TB12
600.0000 mg | ORAL_TABLET | Freq: Two times a day (BID) | ORAL | Status: DC
  Administered 2016-06-05 – 2016-06-06 (×4): 600 mg via ORAL
  Filled 2016-06-05 (×4): qty 1

## 2016-06-05 MED ORDER — ALUM & MAG HYDROXIDE-SIMETH 200-200-20 MG/5ML PO SUSP
30.0000 mL | Freq: Four times a day (QID) | ORAL | Status: DC | PRN
  Administered 2016-06-05 – 2016-06-06 (×2): 30 mL via ORAL
  Filled 2016-06-05 (×2): qty 30

## 2016-06-05 MED ORDER — SODIUM CHLORIDE 0.9% FLUSH: 3.0000 mL | Freq: Two times a day (BID) | INTRAVENOUS | Status: DC

## 2016-06-05 MED ORDER — MAGNESIUM SULFATE 2 GM/50ML IV SOLN
2.0000 g | Freq: Once | INTRAVENOUS | Status: AC
  Administered 2016-06-05: 2 g via INTRAVENOUS
  Filled 2016-06-05: qty 50

## 2016-06-05 MED ORDER — CEFTRIAXONE SODIUM-DEXTROSE 1-3.74 GM-% IV SOLR
1.0000 g | Freq: Once | INTRAVENOUS | Status: AC
  Administered 2016-06-05: 1 g via INTRAVENOUS
  Filled 2016-06-05: qty 50

## 2016-06-05 MED ORDER — AZITHROMYCIN 250 MG PO TABS
250.0000 mg | ORAL_TABLET | Freq: Every day | ORAL | Status: DC
  Administered 2016-06-06: 250 mg via ORAL
  Filled 2016-06-05 (×2): qty 1

## 2016-06-05 MED ORDER — PHENOL 1.4 % MT LIQD
1.0000 | OROMUCOSAL | Status: DC | PRN
  Administered 2016-06-05: 1 via OROMUCOSAL
  Filled 2016-06-05: qty 177

## 2016-06-05 MED ORDER — SODIUM CHLORIDE 0.9% FLUSH
3.0000 mL | Freq: Two times a day (BID) | INTRAVENOUS | Status: DC
  Administered 2016-06-05 – 2016-06-07 (×5): 3 mL via INTRAVENOUS

## 2016-06-05 MED ORDER — FLUTICASONE FUROATE-VILANTEROL 100-25 MCG/INH IN AEPB
1.0000 | INHALATION_SPRAY | Freq: Every day | RESPIRATORY_TRACT | Status: DC
  Administered 2016-06-05 – 2016-06-06 (×2): 1 via RESPIRATORY_TRACT
  Filled 2016-06-05: qty 28

## 2016-06-05 MED ORDER — FLUTICASONE FUROATE-VILANTEROL 100-25 MCG/INH IN AEPB
1.0000 | INHALATION_SPRAY | Freq: Every day | RESPIRATORY_TRACT | Status: DC
  Filled 2016-06-05: qty 28

## 2016-06-05 MED ORDER — IPRATROPIUM-ALBUTEROL 0.5-2.5 (3) MG/3ML IN SOLN
3.0000 mL | Freq: Four times a day (QID) | RESPIRATORY_TRACT | Status: DC
  Administered 2016-06-05 – 2016-06-07 (×9): 3 mL via RESPIRATORY_TRACT
  Filled 2016-06-05 (×9): qty 3

## 2016-06-05 MED ORDER — ACETAMINOPHEN 325 MG PO TABS
650.0000 mg | ORAL_TABLET | Freq: Four times a day (QID) | ORAL | Status: DC | PRN
  Administered 2016-06-05 – 2016-06-06 (×3): 650 mg via ORAL
  Filled 2016-06-05 (×3): qty 2

## 2016-06-05 MED ORDER — VITAMIN C 500 MG PO TABS: 500.0000 mg | ORAL_TABLET | Freq: Every day | ORAL | Status: DC

## 2016-06-05 MED ORDER — DEXTROSE 5 % IV SOLN: 1.0000 g | INTRAVENOUS | Status: DC

## 2016-06-05 MED ORDER — CITALOPRAM HYDROBROMIDE 20 MG PO TABS: 20.0000 mg | ORAL_TABLET | Freq: Every day | ORAL | Status: DC

## 2016-06-05 MED ORDER — VITAMIN C 500 MG PO TABS
500.0000 mg | ORAL_TABLET | Freq: Every day | ORAL | Status: DC
  Administered 2016-06-05 – 2016-06-07 (×3): 500 mg via ORAL
  Filled 2016-06-05 (×3): qty 1

## 2016-06-05 MED ORDER — SENNOSIDES-DOCUSATE SODIUM 8.6-50 MG PO TABS
1.0000 | ORAL_TABLET | Freq: Every day | ORAL | Status: DC
  Administered 2016-06-05 – 2016-06-07 (×3): 1 via ORAL
  Filled 2016-06-05 (×3): qty 1

## 2016-06-05 NOTE — ED Notes (Addendum)
Pt back into bed at this time after urinating.

## 2016-06-05 NOTE — ED Provider Notes (Signed)
[facility] Emergency Department Provider Note    First MD Initiated Contact with Patient 06/05/16 [phone]     (approximate)  I have reviewed the triage vital signs and the nursing notes.   HISTORY  Chief Complaint Shortness of Breath  Level V caveat: acute respiratory distress  HPI Tasha Howe is a 75 y.o. female with history of severe COPD on 2 L of nasal cannula presents with acute shortness of breath that woke her up from sleep at roughly 3 AM. She denies any chest pain with this. On fire department arrival patient was hypoxic to the 80s on 2 L. This improved with him increase of her nasal cannula to 4 L. Patient still markedly to Neck and feeling severely dyspneic. She received 2 albuterol nebulizers and in route with mild improvement. She's not been on any antibiotics recently. Denies any recent steroid use. Has not been using any home nebulizer treatments. Eyes any weight gain, orthopnea or chest pain. No lower extremity swelling.   Past Medical History:  Diagnosis Date  . COPD (chronic obstructive pulmonary disease) (HCC)   . GERD (gastroesophageal reflux disease)   . Hyperlipidemia   . Hypertension   . Thyroid disease     Patient Active Problem List   Diagnosis Date Noted  . Hyperlipemia 04/28/2016  . CAFL (chronic airflow limitation) (HCC) 11/30/2014  . Depression 11/30/2014  . Gastroesophageal reflux disease 11/30/2014  . Hypertension 11/30/2014  . Hypothyroidism (acquired) 11/30/2014    Past Surgical History:  Procedure Laterality Date  . HERNIA REPAIR  08/10/1989  . VAGINAL HYSTERECTOMY  08/10/1958  . vascular stent  08/10/1978    Prior to Admission medications   Medication Sig Start Date End Date Taking? Authorizing Provider  Ascorbic Acid (VITAMIN C) 500 MG CAPS Take 1 capsule by mouth daily.    Historical Provider, MD  aspirin 81 MG tablet Take 1 tablet by mouth daily.    Historical Provider, MD  Calcium Carbonate-Vit D-Min  (CALCIUM 600 + MINERALS PO) Take 1 tablet by mouth daily.    Historical Provider, MD  citalopram (CELEXA) 20 MG tablet Take 1 tablet (20 mg total) by mouth daily. 04/28/16   Duanne Limerick, MD  Fluticasone Furoate-Vilanterol 100-25 MCG/INH AEPB Inhale 1 puff into the lungs daily. Dr Meredeth Ide    Historical Provider, MD  levothyroxine (SYNTHROID, LEVOTHROID) 88 MCG tablet Take 1 tablet (88 mcg total) by mouth daily. 04/28/16   Duanne Limerick, MD  lisinopril (PRINIVIL,ZESTRIL) 5 MG tablet Take 1 tablet (5 mg total) by mouth daily. Dr Lady Gary 04/28/16   Duanne Limerick, MD  magnesium gluconate (MAGONATE) 30 MG tablet Take 2 tablets by mouth daily.    Historical Provider, MD  Omega-3 Fatty Acids (FISH OIL) 1000 MG CPDR Take 3 capsules by mouth daily. 04/28/16   Duanne Limerick, MD  omeprazole (PRILOSEC) 40 MG capsule Take 1 capsule (40 mg total) by mouth daily. 06/01/16   Duanne Limerick, MD  ondansetron (ZOFRAN) 8 MG tablet Take 1 tablet (8 mg total) by mouth every 8 (eight) hours as needed for nausea or vomiting. 04/28/16   Duanne Limerick, MD  senna-docusate (SENOKOT-S) 8.6-50 MG per tablet Take 1 tablet by mouth daily.    Historical Provider, MD  tiotropium (SPIRIVA) 18 MCG inhalation capsule Place 1 capsule into inhaler and inhale daily. Dr Meredeth Ide    Historical Provider, MD    Allergies Sulfa antibiotics  [facility]: nio bleeding disorders  Social History Social  History  Substance Use Topics  . Smoking status: Former Games developermoker  . Smokeless tobacco: Not on file  . Alcohol use No    Review of Systems Patient denies headaches, rhinorrhea, blurry vision, numbness, shortness of breath, chest pain, edema, cough, abdominal pain, nausea, vomiting, diarrhea, dysuria, fevers, rashes or hallucinations unless otherwise stated above in HPI. ____________________________________________   PHYSICAL EXAM:  VITAL SIGNS: Vitals:   06/05/16 0854 06/05/16 0859  BP: (!) 146/92   Pulse: (!) 55   Resp: (!) 26   Temp:  98  F (36.7 C)    Constitutional: Alert and oriented.arrives in acute respiratory distress. Eyes: Conjunctivae are normal. PERRL. EOMI. Head: Atraumatic. Nose: No congestion/rhinnorhea. Mouth/Throat: Mucous membranes are moist.  Oropharynx non-erythematous. Neck: No stridor. Painless ROM. No cervical spine tenderness to palpation Hematological/Lymphatic/Immunilogical: No cervical lymphadenopathy. Cardiovascular: Normal rate, regular rhythm. Grossly normal heart sounds.  Good peripheral circulation. Respiratory: respiratory distress with tachypnea, diminished breath sounds bilaterally with faint end expiratory wheezing. Gastrointestinal: Soft and nontender. No distention. No abdominal bruits. No CVA tenderness. Genitourinary:  Musculoskeletal: No lower extremity tenderness nor edema.  No joint effusions. Neurologic:  Normal speech and language. No gross focal neurologic deficits are appreciated. No gait instability. Skin:  Skin is warm, dry and intact. No rash noted. Psychiatric: Mood and affect are normal. Speech and behavior are normal.  ____________________________________________   LABS (all labs ordered are listed, but only abnormal results are displayed)  No results found for this or any previous visit (from the past 24 hour(s)). ____________________________________________  EKG My review and personal interpretation at Time: 9:23   Indication: sob  Rate: 90  Rhythm: sinus Axis: normal Other: very poor quality EKG due to motion artifact, occasional PVC.  No evidence of STEMI  ____________________________________________  RADIOLOGY  I personally reviewed all radiographic images ordered to evaluate for the above acute complaints and reviewed radiology reports and findings.  These findings were personally discussed with the patient.  Please see medical record for radiology report. ____________________________________________   PROCEDURES  Procedure(s) performed:  none    Critical Care performed:yes CRITICAL CARE Performed by: Willy EddyPatrick Kerney Hopfensperger   Total critical care time: 40 minutes  Critical care time was exclusive of separately billable procedures and treating other patients.  Critical care was necessary to treat or prevent imminent or life-threatening deterioration.  Critical care was time spent personally by me on the following activities: development of treatment plan with patient and/or surrogate as well as nursing, discussions with consultants, evaluation of patient's response to treatment, examination of patient, obtaining history from patient or surrogate, ordering and performing treatments and interventions, ordering and review of laboratory studies, ordering and review of radiographic studies, pulse oximetry and re-evaluation of patient's condition.  ____________________________________________   INITIAL IMPRESSION / ASSESSMENT AND PLAN / ED COURSE  Pertinent labs & imaging results that were available during my care of the patient were reviewed by me and considered in my medical decision making (see chart for details).  EAV:[id number]:Asthma, copd, CHF, pna, ptx, malignancy, Pe, anemia   Jeanella CaraMildred K Sorber is a 75 y.o. who presents to the ED with acute respiratory distress with diminished breath sounds and presentation concerning for acute COPD exacerbation. EKG shows no evidence of acute ischemia. Presentation is not consistent with congestive heart failure. Patient currently maintaining her airway but is markedly to Neck. We'll order stat nebulizers, IV steroids as well as mag due to respiratory distress and reassess.  The patient will be placed on continuous pulse oximetry  and telemetry for monitoring.  Laboratory evaluation will be sent to evaluate for the above complaints.     Clinical Course  Comment By Time  Patient with improvement in rest for status after multiple nebulizer treatments, steroids and magnesium. Still with significant  tachypnea but not requiring significant increase in O2. Do not feel this is clinically consistent with PE. Based on her mom elevated leukocytosis and significant worsening we'll treat for community acquired pneumonia. No evidence of ACS. Discussed case with Dr. Allena Katz who agrees to admit patient for further evaluation management. Willy Eddy, MD 10/27 1022     ____________________________________________   FINAL CLINICAL IMPRESSION(S) / ED DIAGNOSES  Final diagnoses:  Acute respiratory failure with hypoxia (HCC)      NEW MEDICATIONS STARTED DURING THIS VISIT:  New Prescriptions   No medications on file     Note:  This document was prepared using Dragon voice recognition software and may include unintentional dictation errors.    Willy Eddy, MD 06/05/16 [phone]

## 2016-06-05 NOTE — Progress Notes (Signed)
Spoke with Dr. Allena KatzPatel about patients increased WOB.

## 2016-06-05 NOTE — H&P (Signed)
[facility] - [place] at [facility]   PATIENT NAME: Tasha Howe    MR#:  [id number]  DATE OF BIRTH:  1940/10/22  DATE OF ADMISSION:  06/05/2016  PRIMARY CARE PHYSICIAN: Elizabeth Sauer, MD   REQUESTING/REFERRING PHYSICIAN:   CHIEF COMPLAINT:   Chief Complaint  Patient presents with  . Shortness of Breath    HISTORY OF PRESENT ILLNESS: Tasha Howe  is a 75 y.o. female with a known history of  COPD, chronic respiratory failure on 2 L oxygen therapy at home. Presenting with shortness of breath and coughing. Patient reports that she's been having shortness of breath since yesterday. And the breathing got worse since this morning. She also has had a nonproductive cough. She reports normally she is not short of breath with exertion. She denies any fevers chills no chest pains or palpitations. No nausea vomiting or diarrhea. PAST MEDICAL HISTORY:   Past Medical History:  Diagnosis Date  . COPD (chronic obstructive pulmonary disease) (HCC)   . GERD (gastroesophageal reflux disease)   . Hyperlipidemia   . Hypertension   . Thyroid disease     PAST SURGICAL HISTORY: Past Surgical History:  Procedure Laterality Date  . HERNIA REPAIR  08/10/1989  . VAGINAL HYSTERECTOMY  08/10/1958  . vascular stent  08/10/1978    SOCIAL HISTORY:  Social History  Substance Use Topics  . Smoking status: Former Games developer  . Smokeless tobacco: Not on file  . Alcohol use No    FAMILY HISTORY: No family history on file.  DRUG ALLERGIES:  Allergies  Allergen Reactions  . Alendronate     Other reaction(s): Other (See Comments) GI Intolerance  . Sulfa Antibiotics     REVIEW OF SYSTEMS:   CONSTITUTIONAL: No fever, fatigue or weakness.  EYES: No blurred or double vision.  EARS, NOSE, AND THROAT: No tinnitus or ear pain.  RESPIRATORY: Positive cough, positive shortness of breath, positive wheezing no hemoptysis.  CARDIOVASCULAR: No chest pain, orthopnea, edema.   GASTROINTESTINAL: No nausea, vomiting, diarrhea or abdominal pain.  GENITOURINARY: No dysuria, hematuria.  ENDOCRINE: No polyuria, nocturia,  HEMATOLOGY: No anemia, easy bruising or bleeding SKIN: No rash or lesion. MUSCULOSKELETAL: No joint pain or arthritis.   NEUROLOGIC: No tingling, numbness, weakness.  PSYCHIATRY: No anxiety or depression.   MEDICATIONS AT HOME:  Prior to Admission medications   Medication Sig Start Date End Date Taking? Authorizing Provider  Ascorbic Acid (VITAMIN C) 500 MG CAPS Take 1 capsule by mouth daily.   Yes Historical Provider, MD  aspirin 81 MG tablet Take 1 tablet by mouth daily.   Yes Historical Provider, MD  Calcium Carbonate-Vit D-Min (CALCIUM 600 + MINERALS PO) Take 1 tablet by mouth daily.   Yes Historical Provider, MD  citalopram (CELEXA) 20 MG tablet Take 1 tablet (20 mg total) by mouth daily. 04/28/16  Yes Duanne Limerick, MD  Fluticasone Furoate-Vilanterol 100-25 MCG/INH AEPB Inhale 1 puff into the lungs daily. Dr Meredeth Ide   Yes Historical Provider, MD  levothyroxine (SYNTHROID, LEVOTHROID) 88 MCG tablet Take 1 tablet (88 mcg total) by mouth daily. 04/28/16  Yes Duanne Limerick, MD  lisinopril (PRINIVIL,ZESTRIL) 5 MG tablet Take 1 tablet (5 mg total) by mouth daily. Dr Lady Gary 04/28/16  Yes Duanne Limerick, MD  magnesium gluconate (MAGONATE) 30 MG tablet Take 2 tablets by mouth daily.   Yes Historical Provider, MD  Omega-3 Fatty Acids (FISH OIL) 1000 MG CPDR Take 3 capsules by mouth daily. 04/28/16  Yes Deanna C  Yetta Barre, MD  omeprazole (PRILOSEC) 40 MG capsule Take 1 capsule (40 mg total) by mouth daily. 06/01/16  Yes Duanne Limerick, MD  senna-docusate (SENOKOT-S) 8.6-50 MG per tablet Take 1 tablet by mouth daily.   Yes Historical Provider, MD  tiotropium (SPIRIVA) 18 MCG inhalation capsule Place 1 capsule into inhaler and inhale daily. Dr Meredeth Ide   Yes Historical Provider, MD      PHYSICAL EXAMINATION:   VITAL SIGNS: Blood pressure 103/68, pulse 84,  temperature 98 F (36.7 C), temperature source Oral, resp. rate (!) 27, weight 115 lb (52.2 kg), SpO2 98 %.  GENERAL:  75 y.o.-year-old patient lying in the bed with no acute distress.  EYES: Pupils equal, round, reactive to light and accommodation. No scleral icterus. Extraocular muscles intact.  HEENT: Head atraumatic, normocephalic. Oropharynx and nasopharynx clear.  NECK:  Supple, no jugular venous distention. No thyroid enlargement, no tenderness.  LUNGS:Lateral wheezing throughout both lungs with some associated muscle usage no crackles or rhonchi CARDIOVASCULAR: S1, S2 normal. No murmurs, rubs, or gallops.  ABDOMEN: Soft, nontender, nondistended. Bowel sounds present. No organomegaly or mass.  EXTREMITIES: No pedal edema, cyanosis, or clubbing.  NEUROLOGIC: Cranial nerves II through XII are intact. Muscle strength 5/5 in all extremities. Sensation intact. Gait not checked.  PSYCHIATRIC: The patient is alert and oriented x 3.  SKIN: No obvious rash, lesion, or ulcer.   LABORATORY PANEL:   CBC  Recent Labs Lab 06/05/16 0856  WBC 15.4*  HGB 15.4  HCT 45.8  PLT 200  MCV 93.5  MCH 31.5  MCHC 33.7  RDW 14.6*  LYMPHSABS 1.9  MONOABS 0.9  EOSABS 0.2  BASOSABS 0.1   ------------------------------------------------------------------------------------------------------------------  Chemistries   Recent Labs Lab 06/05/16 0856  NA 137  K 3.8  CL 99*  CO2 28  GLUCOSE 111*  BUN 19  CREATININE 0.62  CALCIUM 9.4  MG 2.2  AST 28  ALT 17  ALKPHOS 51  BILITOT 0.6   ------------------------------------------------------------------------------------------------------------------ estimated creatinine clearance is 50.1 mL/min (by C-G formula based on SCr of 0.62 mg/dL). ------------------------------------------------------------------------------------------------------------------ No results for input(s): TSH, T4TOTAL, T3FREE, THYROIDAB in the last 72 hours.  Invalid  input(s): FREET3   Coagulation profile No results for input(s): INR, PROTIME in the last 168 hours. ------------------------------------------------------------------------------------------------------------------- No results for input(s): DDIMER in the last 72 hours. -------------------------------------------------------------------------------------------------------------------  Cardiac Enzymes  Recent Labs Lab 06/05/16 0856  TROPONINI <0.03   ------------------------------------------------------------------------------------------------------------------ Invalid input(s): POCBNP  ---------------------------------------------------------------------------------------------------------------  Urinalysis No results found for: COLORURINE, APPEARANCEUR, LABSPEC, PHURINE, GLUCOSEU, HGBUR, BILIRUBINUR, KETONESUR, PROTEINUR, UROBILINOGEN, NITRITE, LEUKOCYTESUR   RADIOLOGY: Dg Chest Portable 1 View  Result Date: 06/05/2016 CLINICAL DATA:  Shortness of breath beginning this morning. EXAM: PORTABLE CHEST 1 VIEW COMPARISON:  06/01/2006 FINDINGS: Lungs are adequately inflated with emphysematous disease. No focal consolidation or effusion. Cardiomediastinal silhouette is within normal. There is mild calcified plaque over the thoracic aorta. There are mild degenerate changes of the spine as well as curvature of the thoracic spine convex right unchanged. Diffuse osteopenia. IMPRESSION: No acute cardiopulmonary disease. Emphysematous disease. Aortic atherosclerosis. Electronically Signed   By: Elberta Fortis M.D.   On: 06/05/2016 09:40    EKG: Orders placed or performed during the hospital encounter of 06/05/16  . ED EKG  . ED EKG  . EKG 12-Lead  . EKG 12-Lead    IMPRESSION AND PLAN: Patient is a 75 year old white female with chronic respiratory failure on oxygen therapy presents with worsening shortness of breath  1. Acute on chronic COPD  exasperation associated with acute  bronchitis We will treat patient with nebulizers every 6 hours. I will also give her IV Solu-Medrol I will also continue her home inhalers She has evidence of acute bronchitis based on her symptoms will treat with IV ceftriaxone and oral azithromycin   2. Essential hypertension Continue therapy with lisinopril  3. Hypothyroidism continue Synthroid  4. GERD we'll continue omeprazole  5. Miscellaneous Lovenox for DVT prophylaxis All the records are reviewed and case discussed with ED provider. Management plans discussed with the patient, family and they are in agreement.  CODE STATUS: Code Status History    This patient does not have a recorded code status. Please follow your organizational policy for patients in this situation.       TOTAL TIME TAKING CARE OF THIS PATIENT:4955minutes.    Auburn BilberryPATEL, Paiten Boies M.D on 06/05/2016 at 10:21 AM  Between 7am to 6pm - Pager - [phone]  After 6pm go to [email or web address] - password [email or web address]  [facility]  Office  [phone]  CC: Primary care physician; Elizabeth Sauereanna Jones, MD

## 2016-06-05 NOTE — ED Notes (Signed)
Pt states that she is feeling a little bit better. Updated on plan of care.

## 2016-06-05 NOTE — ED Notes (Signed)
MD at bedside. 

## 2016-06-05 NOTE — ED Triage Notes (Signed)
Pt arrives to ER via EMS c/o SOB that began this AM upon wakening. Pt wears 2L [place] at home chronically. Pt was low 80's up FD arrival on oxygen. Pt placed on 4L [place] and saturations 95%. Pt received 2, 2.5mg  albuterol nebulizer with EMS. 20G to L hand initiated by EMS. Pt states that she felt fine yesterday. Denies pain. Dried blood to nose present upon arrival; pt does not use humidifier at home with oxygen. MD at bedside.

## 2016-06-05 NOTE — ED Notes (Addendum)
Pt assisted up to bedside commode at this time.

## 2016-06-06 LAB — CBC
HCT: 42.9 % (ref 35.0–47.0)
Hemoglobin: 14.7 g/dL (ref 12.0–16.0)
MCH: 31.8 pg (ref 26.0–34.0)
MCHC: 34.2 g/dL (ref 32.0–36.0)
MCV: 92.8 fL (ref 80.0–100.0)
PLATELETS: 174 10*3/uL (ref 150–440)
RBC: 4.63 MIL/uL (ref 3.80–5.20)
RDW: 14.5 % (ref 11.5–14.5)
WBC: 17.4 10*3/uL — ABNORMAL HIGH (ref 3.6–11.0)

## 2016-06-06 LAB — BASIC METABOLIC PANEL
Anion gap: 9 (ref 5–15)
BUN: 18 mg/dL (ref 6–20)
CALCIUM: 9.6 mg/dL (ref 8.9–10.3)
CO2: 26 mmol/L (ref 22–32)
CREATININE: 0.74 mg/dL (ref 0.44–1.00)
Chloride: 99 mmol/L — ABNORMAL LOW (ref 101–111)
GFR calc Af Amer: >60 mL/min (ref >60)
GLUCOSE: 139 mg/dL — AB (ref 65–99)
Potassium: 5 mmol/L (ref 3.5–5.1)
SODIUM: 134 mmol/L — AB (ref 135–145)

## 2016-06-06 MED ORDER — LEVOFLOXACIN 500 MG PO TABS
500.0000 mg | ORAL_TABLET | Freq: Every day | ORAL | Status: DC
  Administered 2016-06-07: 500 mg via ORAL
  Filled 2016-06-06: qty 1

## 2016-06-06 MED ORDER — METHYLPREDNISOLONE SODIUM SUCC 125 MG IJ SOLR
60.0000 mg | Freq: Two times a day (BID) | INTRAMUSCULAR | Status: DC
  Administered 2016-06-06: 60 mg via INTRAVENOUS
  Filled 2016-06-06: qty 2

## 2016-06-06 NOTE — Plan of Care (Signed)
Problem: Safety: Goal: Ability to remain free from injury will improve Outcome: Progressing Patient educated on need to call for help before getting out of bed, she verbalizes understanding and demonstrates using the phone and call light

## 2016-06-06 NOTE — Progress Notes (Signed)
[facility] - [place] at [facility]   PATIENT NAME: Tasha SpearmanMildred Howe    MR#:  [id number]  DATE OF BIRTH:  10-11-1940  SUBJECTIVE:  Feels better some today. Sob with exertion  REVIEW OF SYSTEMS:   Review of Systems  Constitutional: Negative for chills, fever and weight loss.  HENT: Negative for ear discharge, ear pain and nosebleeds.   Eyes: Negative for blurred vision, pain and discharge.  Respiratory: Positive for cough and shortness of breath. Negative for sputum production, wheezing and stridor.   Cardiovascular: Negative for chest pain, palpitations, orthopnea and PND.  Gastrointestinal: Negative for abdominal pain, diarrhea, nausea and vomiting.  Genitourinary: Negative for frequency and urgency.  Musculoskeletal: Negative for back pain and joint pain.  Neurological: Positive for weakness. Negative for sensory change, speech change and focal weakness.  Psychiatric/Behavioral: Negative for depression and hallucinations. The patient is not nervous/anxious.    Tolerating Diet:yes Tolerating PT: pending  DRUG ALLERGIES:   Allergies  Allergen Reactions  . Alendronate     Other reaction(s): Other (See Comments) GI Intolerance  . Sulfa Antibiotics     VITALS:  Blood pressure 110/63, pulse 86, temperature 98.1 F (36.7 C), temperature source Oral, resp. rate 20, height 5\' 4"  (1.626 m), weight 51.7 kg (113 lb 14.4 oz), SpO2 95 %.  PHYSICAL EXAMINATION:   Physical Exam  GENERAL:  75 y.o.-year-old patient lying in the bed with no acute distress.  EYES: Pupils equal, round, reactive to light and accommodation. No scleral icterus. Extraocular muscles intact.  HEENT: Head atraumatic, normocephalic. Oropharynx and nasopharynx clear.  NECK:  Supple, no jugular venous distention. No thyroid enlargement, no tenderness.  LUNGS: Normal breath sounds bilaterally, no wheezing, rales, rhonchi. No use of accessory muscles of respiration. Emphysematous  lungs CARDIOVASCULAR: S1, S2 normal. No murmurs, rubs, or gallops.  ABDOMEN: Soft, nontender, nondistended. Bowel sounds present. No organomegaly or mass.  EXTREMITIES: No cyanosis, clubbing or edema b/l.    NEUROLOGIC: Cranial nerves II through XII are intact. No focal Motor or sensory deficits b/l.   PSYCHIATRIC:  patient is alert and oriented x 3.  SKIN: No obvious rash, lesion, or ulcer.   LABORATORY PANEL:  CBC  Recent Labs Lab 06/06/16 0502  WBC 17.4*  HGB 14.7  HCT 42.9  PLT 174    Chemistries   Recent Labs Lab 06/05/16 0856 06/06/16 0502  NA 137 134*  K 3.8 5.0  CL 99* 99*  CO2 28 26  GLUCOSE 111* 139*  BUN 19 18  CREATININE 0.62 0.74  CALCIUM 9.4 9.6  MG 2.2  --   AST 28  --   ALT 17  --   ALKPHOS 51  --   BILITOT 0.6  --    Cardiac Enzymes  Recent Labs Lab 06/05/16 0856  TROPONINI <0.03   RADIOLOGY:  Dg Chest Portable 1 View  Result Date: 06/05/2016 CLINICAL DATA:  Shortness of breath beginning this morning. EXAM: PORTABLE CHEST 1 VIEW COMPARISON:  06/01/2006 FINDINGS: Lungs are adequately inflated with emphysematous disease. No focal consolidation or effusion. Cardiomediastinal silhouette is within normal. There is mild calcified plaque over the thoracic aorta. There are mild degenerate changes of the spine as well as curvature of the thoracic spine convex right unchanged. Diffuse osteopenia. IMPRESSION: No acute cardiopulmonary disease. Emphysematous disease. Aortic atherosclerosis. Electronically Signed   By: Elberta Fortisaniel  Boyle M.D.   On: 06/05/2016 09:40   ASSESSMENT AND PLAN:  75 year old white female with chronic respiratory failure on oxygen therapy  presents with worsening shortness of breath  1. Acute on chronic COPD exaceration associated with acute bronchitis We will treat patient with nebulizers every 6 hours.  IV Solu-Medrol,continue her home inhalers She has evidence of acute bronchitis based on her symptoms will treat with Levaquin CXR  neg for pneumonia  2. Essential hypertension Continue therapy with lisinopril  3. Hypothyroidism continue Synthroid  4. GERD we'll continue omeprazole  5. Miscellaneous Lovenox for DVT prophylaxis  Nebulizer for home  Case discussed with Care Management/Social Worker. Management plans discussed with the patient, family and they are in agreement.  CODE STATUS: full  DVT Prophylaxis: lovenox  TOTAL TIME TAKING CARE OF THIS PATIENT: 30 minutes.  >50% time spent on counselling and coordination of care pt and dter  POSSIBLE D/C IN 1-2 DAYS, DEPENDING ON CLINICAL CONDITION.  Note: This dictation was prepared with Dragon dictation along with smaller phrase technology. Any transcriptional errors that result from this process are unintentional.  Ricco Dershem M.D on 06/06/2016 at 2:38 PM  Between 7am to 6pm - Pager - [phone]  After 6pm go to [email or web address] - password [email or web address] [facility]  [facility]  Office  [phone]  CC: Primary care physician; Elizabeth Sauereanna Jones, MD

## 2016-06-07 LAB — BASIC METABOLIC PANEL
ANION GAP: 6 (ref 5–15)
BUN: 27 mg/dL — ABNORMAL HIGH (ref 6–20)
CO2: 29 mmol/L (ref 22–32)
Calcium: 9.3 mg/dL (ref 8.9–10.3)
Chloride: 99 mmol/L — ABNORMAL LOW (ref 101–111)
Creatinine, Ser: 0.64 mg/dL (ref 0.44–1.00)
Glucose, Bld: 131 mg/dL — ABNORMAL HIGH (ref 65–99)
POTASSIUM: 5 mmol/L (ref 3.5–5.1)
SODIUM: 134 mmol/L — AB (ref 135–145)

## 2016-06-07 MED ORDER — PREDNISONE 10 MG PO TABS: ORAL_TABLET | ORAL | 0 refills | Status: DC

## 2016-06-07 MED ORDER — GUAIFENESIN ER 600 MG PO TB12: 600.0000 mg | ORAL_TABLET | Freq: Two times a day (BID) | ORAL | Status: DC | PRN

## 2016-06-07 MED ORDER — LEVOFLOXACIN 500 MG PO TABS: 500.0000 mg | ORAL_TABLET | Freq: Every day | ORAL | 0 refills | Status: DC

## 2016-06-07 MED ORDER — GUAIFENESIN ER 600 MG PO TB12: 600.0000 mg | ORAL_TABLET | Freq: Two times a day (BID) | ORAL | 0 refills | Status: DC | PRN

## 2016-06-07 MED ORDER — IPRATROPIUM-ALBUTEROL 0.5-2.5 (3) MG/3ML IN SOLN: 3.0000 mL | Freq: Four times a day (QID) | RESPIRATORY_TRACT | 0 refills | Status: AC

## 2016-06-07 MED ORDER — PREDNISONE 50 MG PO TABS
50.0000 mg | ORAL_TABLET | Freq: Every day | ORAL | Status: DC
  Administered 2016-06-07: 50 mg via ORAL
  Filled 2016-06-07 (×2): qty 1

## 2016-06-07 NOTE — Progress Notes (Signed)
Pt to be discharged per Md order. IV removed. Instructions reviewed with pt and family. All questions answered. Education provided on home usage of nebulizer machine. Scripts electronically sent. Pt taken out in wheelchair.

## 2016-06-07 NOTE — Discharge Summary (Signed)
[facility] - [place] at [facility]   PATIENT NAME: Tasha Howe    MR#:  [id number]  DATE OF BIRTH:  27-Jan-1941  DATE OF ADMISSION:  06/05/2016 ADMITTING PHYSICIAN: Auburn BilberryShreyang Theotis Gerdeman, MD  DATE OF DISCHARGE: 06/07/16  PRIMARY CARE PHYSICIAN: Elizabeth Sauereanna Jones, MD    ADMISSION DIAGNOSIS:  Shortness of Breath  DISCHARGE DIAGNOSIS:  Acute on chronic COPD exacerbation Chronic respiratory failure on oxygen Acute bronchitis  SECONDARY DIAGNOSIS:   Past Medical History:  Diagnosis Date  . COPD (chronic obstructive pulmonary disease) (HCC)   . GERD (gastroesophageal reflux disease)   . Hyperlipidemia   . Hypertension   . Thyroid disease     HOSPITAL COURSE:   75 year old white female with chronic respiratory failure on oxygen therapy presents with worsening shortness of breath  1. Acute on chronic COPD exaceration associated with acute bronchitis We will treat patient with nebulizers every 6 hours.  IV Solu-Medrol,continue her home inhalers---change to po taper She has evidence of acute bronchitis based on her symptoms will treat with Levaquin CXR neg for pneumonia  2. Essential hypertension Continue therapy with lisinopril  3. Hypothyroidism continue Synthroid  4. GERD we'll continue omeprazole  5. Miscellaneous Lovenox for DVT prophylaxis  Nebulizer for home  Overall at baseline D/c home CONSULTS OBTAINED:    DRUG ALLERGIES:   Allergies  Allergen Reactions  . Alendronate     Other reaction(s): Other (See Comments) GI Intolerance  . Sulfa Antibiotics     DISCHARGE MEDICATIONS:   Current Discharge Medication List    START taking these medications   Details  guaiFENesin (MUCINEX) 600 MG 12 hr tablet Take 1 tablet (600 mg total) by mouth 2 (two) times daily as needed for cough. Qty: 14 tablet, Refills: 0    ipratropium-albuterol (DUONEB) 0.5-2.5 (3) MG/3ML SOLN Take 3 mLs by nebulization every 6 (six) hours. Qty: 360  mL, Refills: 0    levofloxacin (LEVAQUIN) 500 MG tablet Take 1 tablet (500 mg total) by mouth daily. Qty: 5 tablet, Refills: 0    predniSONE (DELTASONE) 10 MG tablet Take 50 mg daily taper by 10 mg daily then stop Qty: 15 tablet, Refills: 0      CONTINUE these medications which have NOT CHANGED   Details  Ascorbic Acid (VITAMIN C) 500 MG CAPS Take 1 capsule by mouth daily.    aspirin 81 MG tablet Take 1 tablet by mouth daily.    Calcium Carbonate-Vit D-Min (CALCIUM 600 + MINERALS PO) Take 1 tablet by mouth daily.    citalopram (CELEXA) 20 MG tablet Take 1 tablet (20 mg total) by mouth daily. Qty: 90 tablet, Refills: 1   Associated Diagnoses: Depression    Fluticasone Furoate-Vilanterol 100-25 MCG/INH AEPB Inhale 1 puff into the lungs daily. Dr Meredeth IdeFleming    levothyroxine (SYNTHROID, LEVOTHROID) 88 MCG tablet Take 1 tablet (88 mcg total) by mouth daily. Qty: 90 tablet, Refills: 1   Associated Diagnoses: Hypothyroidism, unspecified hypothyroidism type    lisinopril (PRINIVIL,ZESTRIL) 5 MG tablet Take 1 tablet (5 mg total) by mouth daily. Dr Gillis EndsFath Qty: 90 tablet, Refills: 1   Associated Diagnoses: Essential hypertension    magnesium gluconate (MAGONATE) 30 MG tablet Take 2 tablets by mouth daily.    Omega-3 Fatty Acids (FISH OIL) 1000 MG CPDR Take 3 capsules by mouth daily. Qty: 90 capsule, Refills: 3   Associated Diagnoses: Hyperlipemia    omeprazole (PRILOSEC) 40 MG capsule Take 1 capsule (40 mg total) by mouth daily. Qty: 30 capsule, Refills:  3    senna-docusate (SENOKOT-S) 8.6-50 MG per tablet Take 1 tablet by mouth daily.    tiotropium (SPIRIVA) 18 MCG inhalation capsule Place 1 capsule into inhaler and inhale daily. Dr Meredeth Ide        If you experience worsening of your admission symptoms, develop shortness of breath, life threatening emergency, suicidal or homicidal thoughts you must seek medical attention immediately by calling 911 or calling your MD immediately  if  symptoms less severe.  You Must read complete instructions/literature along with all the possible adverse reactions/side effects for all the Medicines you take and that have been prescribed to you. Take any new Medicines after you have completely understood and accept all the possible adverse reactions/side effects.   Please note  You were cared for by a hospitalist during your hospital stay. If you have any questions about your discharge medications or the care you received while you were in the hospital after you are discharged, you can call the unit and asked to speak with the hospitalist on call if the hospitalist that took care of you is not available. Once you are discharged, your primary care physician will handle any further medical issues. Please note that NO REFILLS for any discharge medications will be authorized once you are discharged, as it is imperative that you return to your primary care physician (or establish a relationship with a primary care physician if you do not have one) for your aftercare needs so that they can reassess your need for medications and monitor your lab values. Today   SUBJECTIVE   Feels ok.  VITAL SIGNS:  Blood pressure 122/74, pulse 92, temperature 98.4 F (36.9 C), temperature source Oral, resp. rate 18, height 5\' 4"  (1.626 m), weight 51.7 kg (113 lb 14.4 oz), SpO2 96 %.  I/O:   Intake/Output Summary (Last 24 hours) at 06/07/16 0830 Last data filed at 06/07/16 0732  Gross per 24 hour  Intake              550 ml  Output             1450 ml  Net             -900 ml    PHYSICAL EXAMINATION:  GENERAL:  75 y.o.-year-old patient lying in the bed with no acute distress.  EYES: Pupils equal, round, reactive to light and accommodation. No scleral icterus. Extraocular muscles intact.  HEENT: Head atraumatic, normocephalic. Oropharynx and nasopharynx clear.  NECK:  Supple, no jugular venous distention. No thyroid enlargement, no tenderness.  LUNGS:  Normal breath sounds bilaterally, no wheezing, rales,rhonchi or crepitation. No use of accessory muscles of respiration. Emphysematous chest. CARDIOVASCULAR: S1, S2 normal. No murmurs, rubs, or gallops.  ABDOMEN: Soft, non-tender, non-distended. Bowel sounds present. No organomegaly or mass.  EXTREMITIES: No pedal edema, cyanosis, or clubbing.  NEUROLOGIC: Cranial nerves II through XII are intact. Muscle strength 5/5 in all extremities. Sensation intact. Gait not checked.  PSYCHIATRIC: The patient is alert and oriented x 3.  SKIN: No obvious rash, lesion, or ulcer.   DATA REVIEW:   CBC   Recent Labs Lab 06/06/16 0502  WBC 17.4*  HGB 14.7  HCT 42.9  PLT 174    Chemistries   Recent Labs Lab 06/05/16 0856  06/07/16 0529  NA 137  < > 134*  K 3.8  < > 5.0  CL 99*  < > 99*  CO2 28  < > 29  GLUCOSE 111*  < > 131*  BUN 19  < > 27*  CREATININE 0.62  < > 0.64  CALCIUM 9.4  < > 9.3  MG 2.2  --   --   AST 28  --   --   ALT 17  --   --   ALKPHOS 51  --   --   BILITOT 0.6  --   --   < > = values in this interval not displayed.  Microbiology Results   Recent Results (from the past 240 hour(s))  Blood culture (routine x 2)     Status: None (Preliminary result)   Collection Time: 06/05/16 10:06 AM  Result Value Ref Range Status   Specimen Description BLOOD RIGHT ARM  Final   Special Requests AER 8CC ANA 8CC  Final   Culture NO GROWTH 1 DAY  Final   Report Status PENDING  Incomplete  Blood culture (routine x 2)     Status: None (Preliminary result)   Collection Time: 06/05/16 10:13 AM  Result Value Ref Range Status   Specimen Description BLOOD LEFT HAND  Final   Special Requests NONE  Final   Culture NO GROWTH 1 DAY  Final   Report Status PENDING  Incomplete    RADIOLOGY:  Dg Chest Portable 1 View  Result Date: 06/05/2016 CLINICAL DATA:  Shortness of breath beginning this morning. EXAM: PORTABLE CHEST 1 VIEW COMPARISON:  06/01/2006 FINDINGS: Lungs are adequately  inflated with emphysematous disease. No focal consolidation or effusion. Cardiomediastinal silhouette is within normal. There is mild calcified plaque over the thoracic aorta. There are mild degenerate changes of the spine as well as curvature of the thoracic spine convex right unchanged. Diffuse osteopenia. IMPRESSION: No acute cardiopulmonary disease. Emphysematous disease. Aortic atherosclerosis. Electronically Signed   By: Elberta Fortisaniel  Boyle M.D.   On: 06/05/2016 09:40     Management plans discussed with the patient, family and they are in agreement.  CODE STATUS:     Code Status Orders        Start     Ordered   06/05/16 1213  Full code  Continuous     06/05/16 1212    Code Status History    Date Active Date Inactive Code Status Order ID Comments User Context   This patient has a current code status but no historical code status.      TOTAL TIME TAKING CARE OF THIS PATIENT: 40 minutes.    Maxim Bedel M.D on 06/07/2016 at 8:30 AM  Between 7am to 6pm - Pager - [phone] After 6pm go to [email or web address] - password [email or web address]  [facility]  Office  [phone]  CC: Primary care physician; Elizabeth Sauereanna Jones, MD

## 2016-06-07 NOTE — Discharge Instructions (Signed)
Use your oxygen and nebulizer per instructions °

## 2016-06-07 NOTE — Care Management Note (Signed)
Case Management Note  Patient Details  Name: Tasha Howe MRN: [id number] Date of Birth: 1941-05-07  Subjective/Objective:     Tasha Howe signed for her home nebulizer machine from [facility]. Josh RN was updated that Tasha Howe will need teaching about how to use the nebulizer machine.                Action/Plan:   Expected Discharge Date:                  Expected Discharge Plan:     In-House Referral:     Discharge planning Services     Post Acute Care Choice:    Choice offered to:     DME Arranged:    DME Agency:     HH Arranged:    HH Agency:     Status of Service:     If discussed at [employer] Length of Stay Meetings, dates discussed:    Additional Comments:  Tasha Dworkin A, RN 06/07/2016, 9:39 AM

## 2016-06-10 LAB — CULTURE, BLOOD (ROUTINE X 2)
Culture: NO GROWTH
Culture: NO GROWTH

## 2016-06-15 ENCOUNTER — Ambulatory Visit (INDEPENDENT_AMBULATORY_CARE_PROVIDER_SITE_OTHER): Payer: Medicare Other | Admitting: Family Medicine

## 2016-06-15 ENCOUNTER — Encounter: Payer: Self-pay | Admitting: Family Medicine

## 2016-06-15 VITALS — BP 100/78 | HR 80 | Ht 64.0 in | Wt 110.0 lb

## 2016-06-15 DIAGNOSIS — J432 Centrilobular emphysema: Secondary | ICD-10-CM | POA: Diagnosis not present

## 2016-06-15 DIAGNOSIS — Z09 Encounter for follow-up examination after completed treatment for conditions other than malignant neoplasm: Secondary | ICD-10-CM | POA: Diagnosis not present

## 2016-06-15 NOTE — Progress Notes (Signed)
Name: Tasha CaraMildred K Howe   MRN: [id number]    DOB: Jul 20, 1941   Date:06/15/2016       Progress Note  Subjective  Chief Complaint  Chief Complaint  Patient presents with  . Follow-up    hospital stay- d/c on 29th- "back to baseline"/ has a swollen area on the L) side of jaw    Shortness of Breath  This is a chronic problem. The current episode started more than 1 year ago. The problem occurs intermittently. The problem has been waxing and waning. Associated symptoms include wheezing. Pertinent negatives include no abdominal pain, chest pain, claudication, coryza, ear pain, fever, headaches, hemoptysis, leg pain, leg swelling, neck pain, orthopnea, PND, rash, rhinorrhea, sore throat, sputum production, swollen glands, syncope or vomiting. Nothing aggravates the symptoms. She has tried beta agonist inhalers, ipratropium inhalers and steroid inhalers for the symptoms. The treatment provided moderate relief. Her past medical history is significant for COPD. There is no history of allergies, aspirin allergies, asthma, bronchiolitis, CAD, chronic lung disease, DVT, a heart failure, PE, pneumonia or a recent surgery.    No problem-specific Assessment & Plan notes found for this encounter.   Past Medical History:  Diagnosis Date  . COPD (chronic obstructive pulmonary disease) (HCC)   . GERD (gastroesophageal reflux disease)   . Hyperlipidemia   . Hypertension   . Thyroid disease     Past Surgical History:  Procedure Laterality Date  . HERNIA REPAIR  08/10/1989  . VAGINAL HYSTERECTOMY  08/10/1958  . vascular stent  08/10/1978    History reviewed. No pertinent family history.  Social History   Social History  . Marital status: Divorced    Spouse name: N/A  . Number of children: N/A  . Years of education: N/A   Occupational History  . Not on file.   Social History Main Topics  . Smoking status: Former Smoker    Types: Cigarettes    Quit date: 08/11/2003  . Smokeless tobacco: Never  Used  . Alcohol use No  . Drug use: No  . Sexual activity: Not Currently   Other Topics Concern  . Not on file   Social History Narrative  . No narrative on file    Allergies  Allergen Reactions  . Alendronate     Other reaction(s): Other (See Comments) GI Intolerance  . Sulfa Antibiotics      Review of Systems  Constitutional: Negative for chills, fever, malaise/fatigue and weight loss.  HENT: Negative for ear discharge, ear pain, rhinorrhea and sore throat.   Eyes: Negative for blurred vision.  Respiratory: Positive for shortness of breath and wheezing. Negative for cough, hemoptysis and sputum production.   Cardiovascular: Negative for chest pain, palpitations, orthopnea, claudication, leg swelling, syncope and PND.  Gastrointestinal: Negative for abdominal pain, blood in stool, constipation, diarrhea, heartburn, melena, nausea and vomiting.  Genitourinary: Negative for dysuria, frequency, hematuria and urgency.  Musculoskeletal: Negative for back pain, joint pain, myalgias and neck pain.  Skin: Negative for rash.  Neurological: Negative for dizziness, tingling, sensory change, focal weakness and headaches.  Endo/Heme/Allergies: Negative for environmental allergies and polydipsia. Does not bruise/bleed easily.  Psychiatric/Behavioral: Negative for depression and suicidal ideas. The patient is not nervous/anxious and does not have insomnia.      Objective  Vitals:   06/15/16 1403  BP: 100/78  Pulse: 80  Weight: 110 lb (49.9 kg)  Height: 5\' 4"  (1.626 m)    Physical Exam  Constitutional: She is well-developed, well-nourished, and in no distress.  No distress.  HENT:  Head: Normocephalic and atraumatic.  Right Ear: External ear normal.  Left Ear: External ear normal.  Nose: Nose normal.  Mouth/Throat: Oropharynx is clear and moist.  Eyes: Conjunctivae and EOM are normal. Pupils are equal, round, and reactive to light. Right eye exhibits no discharge. Left eye  exhibits no discharge.  Neck: Normal range of motion. Neck supple. No JVD present. No thyromegaly present.  Cardiovascular: Normal rate, regular rhythm, normal heart sounds and intact distal pulses.  Exam reveals no gallop and no friction rub.   No murmur heard. Pulmonary/Chest: Effort normal and breath sounds normal. She has no wheezes. She has no rales.  Abdominal: Soft. Bowel sounds are normal. She exhibits no mass. There is no tenderness. There is no guarding.  Musculoskeletal: Normal range of motion. She exhibits no edema.  Lymphadenopathy:    She has no cervical adenopathy.  Neurological: She is alert. She has normal reflexes.  Skin: Skin is warm and dry. She is not diaphoretic.  Psychiatric: Mood and affect normal.  Nursing note and vitals reviewed.     Assessment & Plan  Problem List Items Addressed This Visit      Respiratory   CAFL (chronic airflow limitation) (HCC)   Relevant Medications   albuterol (PROAIR HFA) 108 (90 Base) MCG/ACT inhaler    Other Visit Diagnoses    Hospital discharge follow-up    -  Primary        [facility]. [facility]anna Min[facility]  06/15/16

## 2016-06-21 DIAGNOSIS — J449 Chronic obstructive pulmonary disease, unspecified: Secondary | ICD-10-CM | POA: Diagnosis not present

## 2016-06-23 DIAGNOSIS — R0902 Hypoxemia: Secondary | ICD-10-CM | POA: Diagnosis not present

## 2016-06-23 DIAGNOSIS — R0609 Other forms of dyspnea: Secondary | ICD-10-CM | POA: Diagnosis not present

## 2016-06-23 DIAGNOSIS — J439 Emphysema, unspecified: Secondary | ICD-10-CM | POA: Diagnosis not present

## 2016-07-17 DIAGNOSIS — J439 Emphysema, unspecified: Secondary | ICD-10-CM | POA: Diagnosis not present

## 2016-07-21 DIAGNOSIS — J449 Chronic obstructive pulmonary disease, unspecified: Secondary | ICD-10-CM | POA: Diagnosis not present

## 2016-08-12 DIAGNOSIS — J439 Emphysema, unspecified: Secondary | ICD-10-CM | POA: Diagnosis not present

## 2016-08-21 DIAGNOSIS — J449 Chronic obstructive pulmonary disease, unspecified: Secondary | ICD-10-CM | POA: Diagnosis not present

## 2016-09-01 DIAGNOSIS — I359 Nonrheumatic aortic valve disorder, unspecified: Secondary | ICD-10-CM | POA: Diagnosis not present

## 2016-09-01 DIAGNOSIS — I10 Essential (primary) hypertension: Secondary | ICD-10-CM | POA: Diagnosis not present

## 2016-09-01 DIAGNOSIS — J439 Emphysema, unspecified: Secondary | ICD-10-CM | POA: Diagnosis not present

## 2016-09-07 DIAGNOSIS — J439 Emphysema, unspecified: Secondary | ICD-10-CM | POA: Diagnosis not present

## 2016-09-21 DIAGNOSIS — J449 Chronic obstructive pulmonary disease, unspecified: Secondary | ICD-10-CM | POA: Diagnosis not present

## 2016-09-28 ENCOUNTER — Emergency Department: Payer: Medicare Other

## 2016-09-28 ENCOUNTER — Observation Stay
Admission: EM | Admit: 2016-09-28 | Discharge: 2016-09-29 | Disposition: A | Discharge status: Home / Self Care | Payer: Medicare Other | Attending: Internal Medicine | Admitting: Internal Medicine

## 2016-09-28 ENCOUNTER — Encounter: Payer: Self-pay | Admitting: Emergency Medicine

## 2016-09-28 DIAGNOSIS — Z7982 Long term (current) use of aspirin: Secondary | ICD-10-CM | POA: Insufficient documentation

## 2016-09-28 DIAGNOSIS — E039 Hypothyroidism, unspecified: Secondary | ICD-10-CM | POA: Insufficient documentation

## 2016-09-28 DIAGNOSIS — Z87891 Personal history of nicotine dependence: Secondary | ICD-10-CM | POA: Insufficient documentation

## 2016-09-28 DIAGNOSIS — J441 Chronic obstructive pulmonary disease with (acute) exacerbation: Principal | ICD-10-CM | POA: Insufficient documentation

## 2016-09-28 DIAGNOSIS — I10 Essential (primary) hypertension: Secondary | ICD-10-CM | POA: Diagnosis not present

## 2016-09-28 DIAGNOSIS — F329 Major depressive disorder, single episode, unspecified: Secondary | ICD-10-CM | POA: Insufficient documentation

## 2016-09-28 DIAGNOSIS — R0603 Acute respiratory distress: Secondary | ICD-10-CM | POA: Diagnosis present

## 2016-09-28 DIAGNOSIS — Z79899 Other long term (current) drug therapy: Secondary | ICD-10-CM | POA: Diagnosis not present

## 2016-09-28 DIAGNOSIS — K219 Gastro-esophageal reflux disease without esophagitis: Secondary | ICD-10-CM | POA: Diagnosis not present

## 2016-09-28 DIAGNOSIS — F419 Anxiety disorder, unspecified: Secondary | ICD-10-CM | POA: Insufficient documentation

## 2016-09-28 DIAGNOSIS — J440 Chronic obstructive pulmonary disease with acute lower respiratory infection: Secondary | ICD-10-CM | POA: Diagnosis not present

## 2016-09-28 DIAGNOSIS — R0602 Shortness of breath: Secondary | ICD-10-CM | POA: Diagnosis not present

## 2016-09-28 DIAGNOSIS — M4850XA Collapsed vertebra, not elsewhere classified, site unspecified, initial encounter for fracture: Secondary | ICD-10-CM | POA: Diagnosis not present

## 2016-09-28 DIAGNOSIS — M549 Dorsalgia, unspecified: Secondary | ICD-10-CM | POA: Insufficient documentation

## 2016-09-28 LAB — BASIC METABOLIC PANEL
Anion gap: 8 (ref 5–15)
BUN: 18 mg/dL (ref 6–20)
CHLORIDE: 101 mmol/L (ref 101–111)
CO2: 29 mmol/L (ref 22–32)
Calcium: 9.6 mg/dL (ref 8.9–10.3)
Creatinine, Ser: 0.65 mg/dL (ref 0.44–1.00)
Glucose, Bld: 105 mg/dL — ABNORMAL HIGH (ref 65–99)
POTASSIUM: 4.3 mmol/L (ref 3.5–5.1)
SODIUM: 138 mmol/L (ref 135–145)

## 2016-09-28 LAB — CBC
HEMATOCRIT: 44 % (ref 35.0–47.0)
Hemoglobin: 14.7 g/dL (ref 12.0–16.0)
MCH: 31.6 pg (ref 26.0–34.0)
MCHC: 33.4 g/dL (ref 32.0–36.0)
MCV: 94.7 fL (ref 80.0–100.0)
PLATELETS: 224 10*3/uL (ref 150–440)
RBC: 4.64 MIL/uL (ref 3.80–5.20)
RDW: 14.1 % (ref 11.5–14.5)
WBC: 8.7 10*3/uL (ref 3.6–11.0)

## 2016-09-28 LAB — GLUCOSE, CAPILLARY: Glucose-Capillary: 114 mg/dL — ABNORMAL HIGH (ref 65–99)

## 2016-09-28 LAB — TROPONIN I: Troponin I: <0.03 ng/mL (ref <0.03)

## 2016-09-28 MED ORDER — ACETAMINOPHEN 325 MG PO TABS: 650.0000 mg | ORAL_TABLET | Freq: Four times a day (QID) | ORAL | Status: DC | PRN

## 2016-09-28 MED ORDER — ONDANSETRON HCL 4 MG/2ML IJ SOLN: 4.0000 mg | Freq: Four times a day (QID) | INTRAMUSCULAR | Status: DC | PRN

## 2016-09-28 MED ORDER — SENNOSIDES-DOCUSATE SODIUM 8.6-50 MG PO TABS
1.0000 | ORAL_TABLET | Freq: Every day | ORAL | Status: DC
  Administered 2016-09-28 – 2016-09-29 (×2): 1 via ORAL
  Filled 2016-09-28 (×2): qty 1

## 2016-09-28 MED ORDER — HYDROCODONE-ACETAMINOPHEN 5-325 MG PO TABS
1.0000 | ORAL_TABLET | ORAL | Status: DC | PRN
  Administered 2016-09-28 – 2016-09-29 (×4): 2 via ORAL
  Filled 2016-09-28 (×4): qty 2

## 2016-09-28 MED ORDER — METHYLPREDNISOLONE SODIUM SUCC 125 MG IJ SOLR
60.0000 mg | INTRAMUSCULAR | Status: DC
  Administered 2016-09-29: 09:00:00 60 mg via INTRAVENOUS
  Filled 2016-09-28: qty 2

## 2016-09-28 MED ORDER — DEXTROSE 5 % IV SOLN
1.0000 g | INTRAVENOUS | Status: DC
  Administered 2016-09-28 – 2016-09-29 (×2): 1 g via INTRAVENOUS
  Filled 2016-09-28 (×2): qty 10

## 2016-09-28 MED ORDER — PANTOPRAZOLE SODIUM 40 MG PO TBEC
40.0000 mg | DELAYED_RELEASE_TABLET | Freq: Every day | ORAL | Status: DC
  Administered 2016-09-29: 40 mg via ORAL
  Filled 2016-09-28: qty 1

## 2016-09-28 MED ORDER — ADULT MULTIVITAMIN W/MINERALS CH
1.0000 | ORAL_TABLET | Freq: Every day | ORAL | Status: DC
Start: 2016-09-28 — End: 2016-09-29
  Administered 2016-09-28 – 2016-09-29 (×2): 1 via ORAL
  Filled 2016-09-28 (×2): qty 1

## 2016-09-28 MED ORDER — BISACODYL 5 MG PO TBEC: 5.0000 mg | DELAYED_RELEASE_TABLET | Freq: Every day | ORAL | Status: DC | PRN

## 2016-09-28 MED ORDER — ACETAMINOPHEN 650 MG RE SUPP: 650.0000 mg | Freq: Four times a day (QID) | RECTAL | Status: DC | PRN

## 2016-09-28 MED ORDER — IPRATROPIUM-ALBUTEROL 0.5-2.5 (3) MG/3ML IN SOLN
3.0000 mL | Freq: Four times a day (QID) | RESPIRATORY_TRACT | Status: DC
  Administered 2016-09-28 – 2016-09-29 (×3): 3 mL via RESPIRATORY_TRACT
  Filled 2016-09-28 (×3): qty 3

## 2016-09-28 MED ORDER — METAXALONE 800 MG PO TABS
800.0000 mg | ORAL_TABLET | Freq: Three times a day (TID) | ORAL | Status: DC
  Administered 2016-09-28 – 2016-09-29 (×4): 800 mg via ORAL
  Filled 2016-09-28 (×6): qty 1

## 2016-09-28 MED ORDER — LISINOPRIL 5 MG PO TABS
5.0000 mg | ORAL_TABLET | Freq: Every day | ORAL | Status: DC
Start: 2016-09-28 — End: 2016-09-29
  Administered 2016-09-28 – 2016-09-29 (×2): 5 mg via ORAL
  Filled 2016-09-28 (×2): qty 1

## 2016-09-28 MED ORDER — CITALOPRAM HYDROBROMIDE 20 MG PO TABS
20.0000 mg | ORAL_TABLET | Freq: Every day | ORAL | Status: DC
  Administered 2016-09-28 – 2016-09-29 (×2): 20 mg via ORAL
  Filled 2016-09-28 (×2): qty 1

## 2016-09-28 MED ORDER — VITAMIN C 500 MG PO TABS
500.0000 mg | ORAL_TABLET | Freq: Every day | ORAL | Status: DC
  Administered 2016-09-28 – 2016-09-29 (×2): 500 mg via ORAL
  Filled 2016-09-28 (×2): qty 1

## 2016-09-28 MED ORDER — IPRATROPIUM-ALBUTEROL 0.5-2.5 (3) MG/3ML IN SOLN
3.0000 mL | Freq: Once | RESPIRATORY_TRACT | Status: AC
  Administered 2016-09-28: 3 mL via RESPIRATORY_TRACT

## 2016-09-28 MED ORDER — IPRATROPIUM-ALBUTEROL 0.5-2.5 (3) MG/3ML IN SOLN
3.0000 mL | Freq: Once | RESPIRATORY_TRACT | Status: AC
  Administered 2016-09-28: 3 mL via RESPIRATORY_TRACT
  Filled 2016-09-28: qty 6

## 2016-09-28 MED ORDER — LEVOTHYROXINE SODIUM 88 MCG PO TABS
88.0000 ug | ORAL_TABLET | ORAL | Status: DC
  Administered 2016-09-29: 07:00:00 88 ug via ORAL
  Filled 2016-09-28 (×2): qty 1

## 2016-09-28 MED ORDER — ASPIRIN 81 MG PO CHEW
81.0000 mg | CHEWABLE_TABLET | Freq: Every day | ORAL | Status: DC
  Administered 2016-09-29: 81 mg via ORAL
  Filled 2016-09-28: qty 1

## 2016-09-28 MED ORDER — METHYLPREDNISOLONE SODIUM SUCC 125 MG IJ SOLR
125.0000 mg | Freq: Once | INTRAMUSCULAR | Status: AC
  Administered 2016-09-28: 125 mg via INTRAVENOUS
  Filled 2016-09-28: qty 2

## 2016-09-28 MED ORDER — DEXTROSE 5 % IV SOLN
500.0000 mg | INTRAVENOUS | Status: DC
  Administered 2016-09-28: 500 mg via INTRAVENOUS
  Filled 2016-09-28 (×2): qty 500

## 2016-09-28 MED ORDER — TIOTROPIUM BROMIDE MONOHYDRATE 18 MCG IN CAPS
1.0000 | ORAL_CAPSULE | Freq: Every day | RESPIRATORY_TRACT | Status: DC
  Administered 2016-09-29: 18 ug via RESPIRATORY_TRACT
  Filled 2016-09-28: qty 5

## 2016-09-28 MED ORDER — ONDANSETRON HCL 4 MG PO TABS: 4.0000 mg | ORAL_TABLET | Freq: Four times a day (QID) | ORAL | Status: DC | PRN

## 2016-09-28 MED ORDER — ENOXAPARIN SODIUM 40 MG/0.4ML ~~LOC~~ SOLN
40.0000 mg | SUBCUTANEOUS | Status: DC
  Administered 2016-09-28: 40 mg via SUBCUTANEOUS
  Filled 2016-09-28 (×2): qty 0.4

## 2016-09-28 MED ORDER — TRAZODONE HCL 50 MG PO TABS
25.0000 mg | ORAL_TABLET | Freq: Every evening | ORAL | Status: DC | PRN
  Filled 2016-09-28: qty 1

## 2016-09-28 MED ORDER — CALCIUM CARBONATE-VITAMIN D 500-200 MG-UNIT PO TABS
1.0000 | ORAL_TABLET | Freq: Every day | ORAL | Status: DC
  Administered 2016-09-28 – 2016-09-29 (×2): 1 via ORAL
  Filled 2016-09-28 (×2): qty 1

## 2016-09-28 MED ORDER — FLUTICASONE FUROATE-VILANTEROL 100-25 MCG/INH IN AEPB
1.0000 | INHALATION_SPRAY | Freq: Every day | RESPIRATORY_TRACT | Status: DC
  Administered 2016-09-28 – 2016-09-29 (×2): 1 via RESPIRATORY_TRACT
  Filled 2016-09-28: qty 28

## 2016-09-28 NOTE — ED Triage Notes (Addendum)
Pt to ed with c/o sob acute onset this am.  Pt also with hx of anxiety, states she felt hot in the bed this am before the sob began.  Pt is on o2 at 2.5 lpm at home.  Pt sats at triage are 94% on 2.5lpm.  Pt is tachypneic at triage, however, pt able to speak in full sentences and per family pt is in no more resp distress than usual.  Pt denies chest pain at triage.

## 2016-09-28 NOTE — H&P (Signed)
[facility] [place] [facility]   PATIENT NAME: Tasha Howe    MR#:  [id number]  DATE OF BIRTH:  1941/07/21  DATE OF ADMISSION:  09/28/2016  PRIMARY CARE PHYSICIAN: Tasha Sauer, MD   REQUESTING/REFERRING PHYSICIAN: Dr. Cyril Howe  CHIEF COMPLAINT: short of breath, anxiety    Chief Complaint  Patient presents with  . Shortness of Breath  . Anxiety    HISTORY OF PRESENT ILLNESS:  Tasha Howe  is a 76 y.o. female with a known history of COPD on 2 1/2 L of oxygen all the time brought in because of shortness of breath getting progressively worse without improvement with nebulizers at home. Denies any chest pain, fever, lower extremity edema. According to patient's son he tells me that patient was anxious , as she could not catch her breath.  PAST MEDICAL HISTORY:   Past Medical History:  Diagnosis Date  . COPD (chronic obstructive pulmonary disease) (HCC)   . GERD (gastroesophageal reflux disease)   . Hyperlipidemia   . Hypertension   . Thyroid disease     PAST SURGICAL HISTOIRY:   Past Surgical History:  Procedure Laterality Date  . HERNIA REPAIR  08/10/1989  . VAGINAL HYSTERECTOMY  08/10/1958  . vascular stent  08/10/1978    SOCIAL HISTORY:   Social History  Substance Use Topics  . Smoking status: Former Smoker    Types: Cigarettes    Quit date: 08/11/2003  . Smokeless tobacco: Never Used  . Alcohol use No    FAMILY HISTORY:  History reviewed. No pertinent family history.  DRUG ALLERGIES:   Allergies  Allergen Reactions  . Alendronate     Other reaction(s): Other (See Comments) GI Intolerance  . Sulfa Antibiotics     REVIEW OF SYSTEMS:  CONSTITUTIONAL: No fever, fatigue or weakness.  EYES: No blurred or double vision.  EARS, NOSE, AND THROAT: No tinnitus or ear pain.  RESPIRATORY: Cough, shortness of breath. nowheezing or hemoptysis.  CARDIOVASCULAR: No chest pain, orthopnea, edema.  GASTROINTESTINAL: No nausea,  vomiting, diarrhea or abdominal pain.  GENITOURINARY: No dysuria, hematuria.  ENDOCRINE: No polyuria, nocturia,  HEMATOLOGY: No anemia, easy bruising or bleeding SKIN: No rash or lesion. MUSCULOSKELETAL: No joint pain or arthritis.   NEUROLOGIC: No tingling, numbness, weakness.  PSYCHIATRY: has anxiety  MEDICATIONS AT HOME:   Prior to Admission medications   Medication Sig Start Date End Date Taking? Authorizing Provider  albuterol (PROAIR HFA) 108 (90 Base) MCG/ACT inhaler Inhale 2 puffs into the lungs every 6 (six) hours as needed for wheezing or shortness of breath.   Yes Historical Provider, MD  Ascorbic Acid (VITAMIN C) 500 MG CAPS Take 1 capsule by mouth daily.   Yes Historical Provider, MD  aspirin 81 MG tablet Take 1 tablet by mouth daily.   Yes Historical Provider, MD  Calcium Carbonate-Vit D-Min (CALCIUM 600 + MINERALS PO) Take 1 tablet by mouth daily.   Yes Historical Provider, MD  citalopram (CELEXA) 20 MG tablet Take 1 tablet (20 mg total) by mouth daily. 04/28/16  Yes Tasha Limerick, MD  Fluticasone Furoate-Vilanterol 100-25 MCG/INH AEPB Inhale 1 puff into the lungs daily. Dr Meredeth Ide   Yes Historical Provider, MD  guaiFENesin (MUCINEX) 600 MG 12 hr tablet Take 1 tablet (600 mg total) by mouth 2 (two) times daily as needed for cough. 06/07/16  Yes Tasha Finner, MD  ipratropium-albuterol (DUONEB) 0.5-2.5 (3) MG/3ML SOLN Take 3 mLs by nebulization every 6 (six) hours. 06/07/16  Yes Tasha  Allena KatzPatel, MD  levothyroxine (SYNTHROID, LEVOTHROID) 88 MCG tablet Take 1 tablet (88 mcg total) by mouth daily. 04/28/16  Yes Tasha Limerickeanna C Jones, MD  lisinopril (PRINIVIL,ZESTRIL) 5 MG tablet Take 1 tablet (5 mg total) by mouth daily. Dr Tasha Howe 04/28/16  Yes Tasha Limerickeanna C Jones, MD  Multiple Vitamin (MULTIVITAMIN) tablet Take 1 tablet by mouth daily.   Yes Historical Provider, MD  Omega-3 Fatty Acids (FISH OIL) 1000 MG CPDR Take 3 capsules by mouth daily. Patient taking differently: Take 2 capsules by mouth daily.   04/28/16  Yes Tasha Limerickeanna C Jones, MD  omeprazole (PRILOSEC) 40 MG capsule Take 1 capsule (40 mg total) by mouth daily. 06/01/16  Yes Tasha Limerickeanna C Jones, MD  tiotropium (SPIRIVA) 18 MCG inhalation capsule Place 1 capsule into inhaler and inhale daily. Dr Meredeth IdeFleming   Yes Historical Provider, MD  senna-docusate (SENOKOT-S) 8.6-50 MG per tablet Take 1 tablet by mouth daily.    Historical Provider, MD      VITAL SIGNS:  Blood pressure 100/66, pulse 96, temperature 98.2 F (36.8 C), temperature source Oral, resp. rate (!) 24, weight 46.7 kg (103 lb), SpO2 100 %.  PHYSICAL EXAMINATION:  GENERAL:  76 y.o.-year-old patient lying in the bed with no acute distress.  EYES: Pupils equal, round, reactive to light and accommodation. No scleral icterus. Extraocular muscles intact.  HEENT: Head atraumatic, normocephalic. Oropharynx and nasopharynx clear.  NECK:  Supple, no jugular venous distention. No thyroid enlargement, no tenderness.  LUNGS: Normal breath sounds bilaterally, no wheezing, rales,rhonchi or crepitation. No use of accessory muscles of respiration.  CARDIOVASCULAR: S1, S2 normal. No murmurs, rubs, or gallops.  ABDOMEN: Soft, nontender, nondistended. Bowel sounds present. No organomegaly or mass.  EXTREMITIES: No pedal edema, cyanosis, or clubbing.  NEUROLOGIC: Cranial nerves II through XII are intact. Muscle strength 5/5 in all extremities. Sensation intact. Gait not checked.  PSYCHIATRIC: The patient is alert and oriented x 3.  SKIN: No obvious rash, lesion, or ulcer.   LABORATORY PANEL:   CBC  Recent Labs Lab 09/28/16 1110  WBC 8.7  HGB 14.7  HCT 44.0  PLT 224   ------------------------------------------------------------------------------------------------------------------  Chemistries   Recent Labs Lab 09/28/16 1110  NA 138  K 4.3  CL 101  CO2 29  GLUCOSE 105*  BUN 18  CREATININE 0.65  CALCIUM 9.6    ------------------------------------------------------------------------------------------------------------------  Cardiac Enzymes  Recent Labs Lab 09/28/16 1110  TROPONINI <0.03   ------------------------------------------------------------------------------------------------------------------  RADIOLOGY:  Dg Chest 2 View  Result Date: 09/28/2016 CLINICAL DATA:  Shortness of breath starting this morning EXAM: CHEST  2 VIEW COMPARISON:  06/05/2016 FINDINGS: Cardiomediastinal silhouette is stable. Hyperinflation and chronic emphysematous changes again noted. There is streaky right base medially atelectasis or infiltrate. Mild thoracic dextroscoliosis. Diffuse thoracic spine osteopenia. Multiple moderate compression deformities are noted mid thoracic spine. Moderate compression deformity probable T11 vertebral body. This are of indeterminate age. Clinical correlation is necessary. Left base retrocardiac atelectasis or infiltrate seen on lateral view. IMPRESSION: Again noted hyperinflation and chronic interstitial changes. There is right base medially streaky atelectasis or infiltrate. Left base retrocardiac atelectasis or infiltrate. Multiple compression deformities are noted mid thoracic spine and lower thoracic spine of indeterminate age. Clinical correlation is necessary. Diffuse osteopenia thoracic spine. Mild thoracic dextroscoliosis Electronically Signed   By: Natasha MeadLiviu  Pop M.D.   On: 09/28/2016 13:45    EKG:   Orders placed or performed during the Howe encounter of 09/28/16  . ED EKG  . ED EKG  Sinus rhythm at 90  bpm with some PVCs.  IMPRESSION AND PLAN:   76 year old female patient with COPD comes in with shortness of breath getting worse but evidence of fever or WBC. Chest x-ray concerning for left base pneumonia. Due to advanced age, COPD admitted to hospitalist service under observation status, continue IV steroids, short course of steroids, nebulizers. #2  depression/anxiety #3 back pain mainly in the upper back, x-ray of the chest showed numerous compression fractures patient says that she has back pain for a long time and does not want to see orthopedic physician for evaluation. #4 GERD #5 essential hypertension: Controlled., History of aortic valve disease, follows up with Dr. Theda Sers #6. hypothyroidism continue Synthroid. #7.depression: Continue Celexa  likely  discharge tomorrow, discussed with patient's son.  All the records are reviewed and case discussed with ED provider. Management plans discussed with the patient, family and they are in agreement.  CODE STATUS:full  TOTAL TIME TAKING CARE OF THIS PATIENT: 55 minutes.    Katha Hamming M.D on 09/28/2016 at 3:34 PM  Between 7am to 6pm - Pager - [phone]  After 6pm go to [email or web address] - password [email or web address]  [facility]  Office  [phone]  CC: Primary care physician; Tasha Sauer, MD  Note: This dictation was prepared with Dragon dictation along with smaller phrase technology. Any transcriptional errors that result from this process are unintentional.

## 2016-09-28 NOTE — ED Provider Notes (Signed)
[facility] Emergency Department Provider Note   ____________________________________________    I have reviewed the triage vital signs and the nursing notes.   HISTORY  Chief Complaint Shortness of Breath and Anxiety     HPI Tasha Howe is a 76 y.o. female who presents with shortness of breath. Patient reports a history of COPD, she is on 2 and half liters nasal cannula 24 7 sometimes has to increase with exertion. She reports over the last 3-5 days her breathing has worsened significantly. She reports some chest tightness she has been using nebulizers every 6 hours around-the-clock without improvement. She denies fevers or chills. No recent travel no calf pain or swelling.   Past Medical History:  Diagnosis Date  . COPD (chronic obstructive pulmonary disease) (HCC)   . GERD (gastroesophageal reflux disease)   . Hyperlipidemia   . Hypertension   . Thyroid disease     Patient Active Problem List   Diagnosis Date Noted  . Acute bronchitis with chronic obstructive pulmonary disease (COPD) (HCC) 06/05/2016  . Hyperlipemia 04/28/2016  . CAFL (chronic airflow limitation) (HCC) 11/30/2014  . Depression 11/30/2014  . Gastroesophageal reflux disease 11/30/2014  . Hypertension 11/30/2014  . Hypothyroidism (acquired) 11/30/2014    Past Surgical History:  Procedure Laterality Date  . HERNIA REPAIR  08/10/1989  . VAGINAL HYSTERECTOMY  08/10/1958  . vascular stent  08/10/1978    Prior to Admission medications   Medication Sig Start Date End Date Taking? Authorizing Provider  albuterol (PROAIR HFA) 108 (90 Base) MCG/ACT inhaler Inhale 2 puffs into the lungs every 6 (six) hours as needed for wheezing or shortness of breath.   Yes Historical Provider, MD  Ascorbic Acid (VITAMIN C) 500 MG CAPS Take 1 capsule by mouth daily.   Yes Historical Provider, MD  aspirin 81 MG tablet Take 1 tablet by mouth daily.   Yes Historical Provider, MD  Calcium  Carbonate-Vit D-Min (CALCIUM 600 + MINERALS PO) Take 1 tablet by mouth daily.   Yes Historical Provider, MD  citalopram (CELEXA) 20 MG tablet Take 1 tablet (20 mg total) by mouth daily. 04/28/16  Yes Duanne Limerick, MD  Fluticasone Furoate-Vilanterol 100-25 MCG/INH AEPB Inhale 1 puff into the lungs daily. Dr Meredeth Ide   Yes Historical Provider, MD  guaiFENesin (MUCINEX) 600 MG 12 hr tablet Take 1 tablet (600 mg total) by mouth 2 (two) times daily as needed for cough. 06/07/16  Yes Enedina Finner, MD  ipratropium-albuterol (DUONEB) 0.5-2.5 (3) MG/3ML SOLN Take 3 mLs by nebulization every 6 (six) hours. 06/07/16  Yes Enedina Finner, MD  levothyroxine (SYNTHROID, LEVOTHROID) 88 MCG tablet Take 1 tablet (88 mcg total) by mouth daily. 04/28/16  Yes Duanne Limerick, MD  lisinopril (PRINIVIL,ZESTRIL) 5 MG tablet Take 1 tablet (5 mg total) by mouth daily. Dr Lady Gary 04/28/16  Yes Duanne Limerick, MD  Multiple Vitamin (MULTIVITAMIN) tablet Take 1 tablet by mouth daily.   Yes Historical Provider, MD  Omega-3 Fatty Acids (FISH OIL) 1000 MG CPDR Take 3 capsules by mouth daily. Patient taking differently: Take 2 capsules by mouth daily.  04/28/16  Yes Duanne Limerick, MD  omeprazole (PRILOSEC) 40 MG capsule Take 1 capsule (40 mg total) by mouth daily. 06/01/16  Yes Duanne Limerick, MD  tiotropium (SPIRIVA) 18 MCG inhalation capsule Place 1 capsule into inhaler and inhale daily. Dr Meredeth Ide   Yes Historical Provider, MD  senna-docusate (SENOKOT-S) 8.6-50 MG per tablet Take 1 tablet by mouth daily.  Historical Provider, MD     Allergies Alendronate and Sulfa antibiotics  History reviewed. No pertinent family history.  Social History Social History  Substance Use Topics  . Smoking status: Former Smoker    Types: Cigarettes    Quit date: 08/11/2003  . Smokeless tobacco: Never Used  . Alcohol use No    Review of Systems  Constitutional: No fever/chills Eyes: No visual changes.   Cardiovascular: As above Respiratory: As  above Gastrointestinal: No abdominal pain.    Genitourinary: Negative for dysuria. Musculoskeletal: Negative for back pain. Skin: Negative for rash. Neurological: Negative for headaches   10-point ROS otherwise negative.  ____________________________________________   PHYSICAL EXAM:  VITAL SIGNS: ED Triage Vitals  Enc Vitals Group     BP 09/28/16 1050 138/78     Pulse Rate 09/28/16 1050 94     Resp 09/28/16 1050 (!) 32     Temp 09/28/16 1050 97.7 F (36.5 C)     Temp Source 09/28/16 1050 Oral     SpO2 09/28/16 1050 94 %     Weight 09/28/16 1051 103 lb (46.7 kg)     Height --      Head Circumference --      Peak Flow --      Pain Score --      Pain Loc --      Pain Edu? --      Excl. in GC? --    Constitutional: Alert and oriented. No acute distress. Pleasant and interactive Eyes: Conjunctivae are normal.   Nose: No congestion/rhinnorhea. Mouth/Throat: Mucous membranes are moist.    Cardiovascular: Normal rate, regular rhythm. Grossly normal heart sounds.  Good peripheral circulation. Respiratory: Increased respiratory effort No retractions. Poor airflow bilaterally Gastrointestinal: Soft and nontender. No distention.  No CVA tenderness. Genitourinary: deferred Musculoskeletal: No lower extremity tenderness nor edema.  Warm and well perfused Neurologic:  Normal speech and language. No gross focal neurologic deficits are appreciated.  Skin:  Skin is warm, dry and intact. No rash noted. Psychiatric: Mood and affect are normal. Speech and behavior are normal.  ____________________________________________   LABS (all labs ordered are listed, but only abnormal results are displayed)  Labs Reviewed  BASIC METABOLIC PANEL - Abnormal; Notable for the following:       Result Value   Glucose, Bld 105 (*)    All other components within normal limits  CBC  TROPONIN I   ____________________________________________  EKG  ED ECG REPORT I, Jene EveryKINNER, Calyssa Zobrist, the  attending physician, personally viewed and interpreted this ECG.  Date: 09/28/2016  Rate: 99 Rhythm: normal sinus rhythm QRS Axis: normal Intervals: normal ST/T Wave abnormalities: Nonspecific Conduction Disturbances: none   ____________________________________________  RADIOLOGY  Atelectasis on chest x-ray ____________________________________________   PROCEDURES  Procedure(s) performed: No    Critical Care performed: No ____________________________________________   INITIAL IMPRESSION / ASSESSMENT AND PLAN / ED COURSE  Pertinent labs & imaging results that were available during my care of the patient were reviewed by me and considered in my medical decision making (see chart for details).  Patient presents with increasing shortness of breath despite every six-hour nebs at home. She is tachypneic and mildly tachycardic. She was treated with Solu-Medrol and DuoNebs with minimal improvement. She will require admission for further management    ____________________________________________   FINAL CLINICAL IMPRESSION(S) / ED DIAGNOSES  Final diagnoses:  COPD exacerbation (HCC)      NEW MEDICATIONS STARTED DURING THIS VISIT:  New Prescriptions   No medications  on file     Note:  This document was prepared using Dragon voice recognition software and may include unintentional dictation errors.    Jene Every, MD 09/28/16 1434

## 2016-09-28 NOTE — ED Notes (Signed)
Admitting into see pt

## 2016-09-28 NOTE — ED Notes (Signed)
ECG was completed out in triage.

## 2016-09-29 DIAGNOSIS — K219 Gastro-esophageal reflux disease without esophagitis: Secondary | ICD-10-CM | POA: Diagnosis not present

## 2016-09-29 DIAGNOSIS — J440 Chronic obstructive pulmonary disease with acute lower respiratory infection: Secondary | ICD-10-CM | POA: Diagnosis not present

## 2016-09-29 DIAGNOSIS — E039 Hypothyroidism, unspecified: Secondary | ICD-10-CM | POA: Diagnosis not present

## 2016-09-29 DIAGNOSIS — I10 Essential (primary) hypertension: Secondary | ICD-10-CM | POA: Diagnosis not present

## 2016-09-29 LAB — BASIC METABOLIC PANEL
Anion gap: 5 (ref 5–15)
BUN: 27 mg/dL — AB (ref 6–20)
CALCIUM: 9.7 mg/dL (ref 8.9–10.3)
CHLORIDE: 101 mmol/L (ref 101–111)
CO2: 28 mmol/L (ref 22–32)
CREATININE: 0.77 mg/dL (ref 0.44–1.00)
GFR calc Af Amer: >60 mL/min (ref >60)
GFR calc non Af Amer: >60 mL/min (ref >60)
GLUCOSE: 114 mg/dL — AB (ref 65–99)
Potassium: 5.1 mmol/L (ref 3.5–5.1)
Sodium: 134 mmol/L — ABNORMAL LOW (ref 135–145)

## 2016-09-29 LAB — CBC
HCT: 38.5 % (ref 35.0–47.0)
Hemoglobin: 13.3 g/dL (ref 12.0–16.0)
MCH: 32.4 pg (ref 26.0–34.0)
MCHC: 34.6 g/dL (ref 32.0–36.0)
MCV: 93.8 fL (ref 80.0–100.0)
PLATELETS: 224 10*3/uL (ref 150–440)
RBC: 4.1 MIL/uL (ref 3.80–5.20)
RDW: 14.3 % (ref 11.5–14.5)
WBC: 10.2 10*3/uL (ref 3.6–11.0)

## 2016-09-29 LAB — INFLUENZA PANEL BY PCR (TYPE A & B)
Influenza A By PCR: NEGATIVE
Influenza B By PCR: NEGATIVE

## 2016-09-29 LAB — GLUCOSE, CAPILLARY: Glucose-Capillary: 86 mg/dL (ref 65–99)

## 2016-09-29 LAB — PROCALCITONIN: Procalcitonin: <0.1 ng/mL

## 2016-09-29 MED ORDER — PREDNISONE 10 MG (21) PO TBPK: ORAL_TABLET | ORAL | 0 refills | Status: DC

## 2016-09-29 MED ORDER — FLUTICASONE-SALMETEROL 100-50 MCG/DOSE IN AEPB: 1.0000 | INHALATION_SPRAY | Freq: Two times a day (BID) | RESPIRATORY_TRACT | 0 refills | Status: DC

## 2016-09-29 MED ORDER — HYDROCODONE-ACETAMINOPHEN 5-325 MG PO TABS: 1.0000 | ORAL_TABLET | Freq: Four times a day (QID) | ORAL | 0 refills | Status: DC | PRN

## 2016-09-29 MED ORDER — AZITHROMYCIN 500 MG PO TABS: 500.0000 mg | ORAL_TABLET | Freq: Every day | ORAL | Status: DC

## 2016-09-29 MED ORDER — CEFUROXIME AXETIL 250 MG PO TABS: 250.0000 mg | ORAL_TABLET | Freq: Two times a day (BID) | ORAL | 0 refills | Status: DC

## 2016-09-29 MED ORDER — IPRATROPIUM-ALBUTEROL 0.5-2.5 (3) MG/3ML IN SOLN
3.0000 mL | Freq: Three times a day (TID) | RESPIRATORY_TRACT | Status: DC
  Administered 2016-09-29 (×2): 3 mL via RESPIRATORY_TRACT
  Filled 2016-09-29 (×2): qty 3

## 2016-09-29 NOTE — Evaluation (Signed)
Physical Therapy Evaluation Patient Details Name: Tasha Howe MRN: [id number] DOB: Dec 08, 1940 Today's Date: 09/29/2016   History of Present Illness  Pt is a pleasant 76 yo female, admitted to [facility] w/ SOB diagnosed w/ Acute respiratory distress, has history of GERD, HTN, COPD, and HLD.   Clinical Impression  .pt awake, alert and willing to participate in PT eval. She demonstrated good overall strength for functional tasks and able to transfer and ambulate w/ RW under PT supervision. She has minor balance deficits and required RW in standing to improve stability. During ambulation she initially required cuing/education for safe use of RW and frequent cuing for deep breathing techniques to improve energy conservation. Pt limited in ambulation due to cardiopulmonary impairments and requested to return to room when she started to feel weaker. It was difficult to obtain accurate O2 reading w/ pulse oximeter but O2 levels remained in high 80's low 90's on 3 L/min. Pt will benefit from skilled PT to improve cardiopulmonary endurance and balance deficits, recommend HHPT following acute hospital stay.     Follow Up Recommendations Home health PT    Equipment Recommendations  Rolling walker with 5" wheels    Recommendations for Other Services       Precautions / Restrictions Precautions Precautions: Fall Restrictions Weight Bearing Restrictions: No      Mobility  Bed Mobility Overal bed mobility: Independent                Transfers Overall transfer level: Modified independent Equipment used: Rolling walker (2 wheeled)             General transfer comment: Flexed posture, cuing for hand placement   Ambulation/Gait Ambulation/Gait assistance: Supervision Ambulation Distance (Feet): 100 Feet Assistive device: Rolling walker (2 wheeled) Gait Pattern/deviations: Step-through pattern;Decreased stride length;Trunk flexed     General Gait Details: requires some cuing for  safe use of RW, limited by fatigue and stated she started feeling weak, frequent cues for deep breathing techniques   Stairs            Wheelchair Mobility    Modified Rankin (Stroke Patients Only)       Balance Overall balance assessment: Needs assistance Sitting-balance support: Feet supported Sitting balance-Leahy Scale: Good Sitting balance - Comments: able to maintain seated posture w/ slighlty flexed posture    Standing balance support: Bilateral upper extremity supported Standing balance-Leahy Scale: Good Standing balance comment: requires RW for additional stability                             Pertinent Vitals/Pain Pain Assessment: 0-10 Pain Score: 5  Pain Location: L back pain Pain Descriptors / Indicators: Aching;Dull Pain Intervention(s): Monitored during session;Repositioned    Home Living Family/patient expects to be discharged to:: Private residence Living Arrangements: Alone Available Help at Discharge: Family;Available PRN/intermittently (daughter available 3 days/week) Type of Home: House Home Access: Stairs to enter Entrance Stairs-Rails: None Entrance Stairs-Number of Steps: 2 Home Layout: One level Home Equipment: Walker - 4 wheels;Cane - single point      Prior Function Level of Independence: Independent with assistive device(s)         Comments: Able to drive and perform household ADLs and IADLs, states she does not leave the house very often and uses a cane and 4 wheel walker intermittently     Hand Dominance        Extremity/Trunk Assessment   Upper Extremity Assessment Upper Extremity Assessment:  Overall [facility] for tasks assessed    Lower Extremity Assessment Lower Extremity Assessment: Overall WFL for tasks assessed       Communication   Communication: No difficulties  Cognition Arousal/Alertness: Awake/alert Behavior During Therapy: WFL for tasks assessed/performed Overall Cognitive Status: Within Functional  Limits for tasks assessed                      General Comments      Exercises     Assessment/Plan    PT Assessment Patient needs continued PT services  PT Problem List Decreased activity tolerance;Cardiopulmonary status limiting activity;Decreased knowledge of use of DME;Decreased balance       PT Treatment Interventions DME instruction;Gait training;Stair training;Functional mobility training;Balance training;Therapeutic exercise;Therapeutic activities;Patient/family education    PT Goals (Current goals can be found in the Care Plan section)  Acute Rehab PT Goals Patient Stated Goal: Return home PT Goal Formulation: With patient Time For Goal Achievement: 10/13/16 Potential to Achieve Goals: Good    Frequency Min 2X/week   Barriers to discharge Decreased caregiver support      Co-evaluation               End of Session Equipment Utilized During Treatment: Gait belt;Oxygen Activity Tolerance: Patient limited by fatigue Patient left: in chair;with call bell/phone within reach;with chair alarm set Nurse Communication: Mobility status PT Visit Diagnosis: Other abnormalities of gait and mobility (R26.89)         Time: [id number] PT Time Calculation (min) (ACUTE ONLY): 28 min   Charges:         PT G Codes:         [employer] Floreine Kingdon Student PT 09/29/2016, 2:45 PM

## 2016-09-29 NOTE — Progress Notes (Signed)
PHARMACIST - PHYSICIAN COMMUNICATION DR:   Vachhani CONCERNING: Antibiotic IV to Oral Route Change Policy  RECOMMENDATION: This patient is receiving azithromycin by the intravenous route.  Based on criteria approved by the Pharmacy and Therapeutics Committee, the antibiotic(s) is/are being converted to the equivalent oral dose form(s).   DESCRIPTION: These criteria include:  Patient being treated for a respiratory tract infection, urinary tract infection, cellulitis or clostridium difficile associated diarrhea if on metronidazole  The patient is not neutropenic and does not exhibit a GI malabsorption state  The patient is eating (either orally or via tube) and/or has been taking other orally administered medications for a least 24 hours  The patient is improving clinically and has a Tmax < 100.5  If you have questions about this conversion, please contact the Pharmacy Department  []  ( [phone] )  [place] [x]  ( [phone] )  [facility] []  ( [phone] )  [place] []  ( [phone] )  [facility] []  ( [phone] )  [facility]   

## 2016-09-29 NOTE — Care Management (Signed)
Admitted to [facility] under observation status with the diagnosis of acute respiratory distress. Lives alone. Son is Mellody DanceKeith [phone]) and daughter [phone]). Sees Dr Franky Machoeanne Jones at primary care physician. No home health. No skilled facility. Home oxygen per [facility] for 5 years. 2.5-3 liters per nasal cannula continuous. Takes care of all basic and instrumental activities of daily living herself, dives short distances. [facility] [place]. No Life Alert (will give information). Cane and rolling walker in the home. No falls. Decreased appetite. Lost 9-10 pounds in the last 2 months. Family will transport Gwenette GreetBrenda S Elysha Daw RN MSN CCM Care Management

## 2016-09-29 NOTE — Progress Notes (Signed)
Patient discharged home per MD order. Prescriptions given to patient. All discharge instructions given and all questions answered. 

## 2016-09-29 NOTE — Discharge Summary (Signed)
[facility] [place] [facility]   PATIENT NAME: Tasha Howe    MR#:  [id number]  DATE OF BIRTH:  1941/01/08  DATE OF ADMISSION:  09/28/2016 ADMITTING PHYSICIAN: Katha Hamming, MD  DATE OF DISCHARGE: 09/29/2016  PRIMARY CARE PHYSICIAN: Elizabeth Sauer, MD    ADMISSION DIAGNOSIS:  COPD exacerbation (HCC) [J44.1]  DISCHARGE DIAGNOSIS:  Active Problems:   Acute respiratory distress   COPD exacerbation.  SECONDARY DIAGNOSIS:   Past Medical History:  Diagnosis Date  . COPD (chronic obstructive pulmonary disease) (HCC)   . GERD (gastroesophageal reflux disease)   . Hyperlipidemia   . Hypertension   . Thyroid disease     HOSPITAL COURSE:   #49- 76 year old female patient with COPD comes in with shortness of breath getting worse but evidence of fever or WBC. Chest x-ray concerning for left base pneumonia. Due to advanced age, COPD admitted to hospitalist service under observation status, continue IV steroids, short course of steroids, nebulizers.  Felt much better next day. Off oxygen. #2 depression/anxiety #3 back pain mainly in the upper back, x-ray of the chest showed numerous compression fractures patient says that she has back pain for a long time and does not want to see orthopedic physician for evaluation. #4 GERD #5 essential hypertension: Controlled., History of aortic valve disease, follows up with Dr. Theda Sers #6. hypothyroidism continue Synthroid. #7.depression: Continue Celexa   DISCHARGE CONDITIONS:   Stable.  CONSULTS OBTAINED:    DRUG ALLERGIES:   Allergies  Allergen Reactions  . Alendronate     Other reaction(s): Other (See Comments) GI Intolerance  . Sulfa Antibiotics     DISCHARGE MEDICATIONS:   Current Discharge Medication List    START taking these medications   Details  cefUROXime (CEFTIN) 250 MG tablet Take 1 tablet (250 mg total) by mouth 2 (two) times daily with a meal. Qty: 4 tablet, Refills: 0     Fluticasone-Salmeterol (ADVAIR DISKUS) 100-50 MCG/DOSE AEPB Inhale 1 puff into the lungs 2 (two) times daily. Qty: 60 each, Refills: 0    predniSONE (STERAPRED UNI-PAK 21 TAB) 10 MG (21) TBPK tablet Take 6 tabs first day, 5 tab on day 2, then 4 on day 3rd, 3 tabs on day 4th , 2 tab on day 5th, and 1 tab on 6th day. Qty: 21 tablet, Refills: 0      CONTINUE these medications which have NOT CHANGED   Details  albuterol (PROAIR HFA) 108 (90 Base) MCG/ACT inhaler Inhale 2 puffs into the lungs every 6 (six) hours as needed for wheezing or shortness of breath.    Ascorbic Acid (VITAMIN C) 500 MG CAPS Take 1 capsule by mouth daily.    aspirin 81 MG tablet Take 1 tablet by mouth daily.    Calcium Carbonate-Vit D-Min (CALCIUM 600 + MINERALS PO) Take 1 tablet by mouth daily.    citalopram (CELEXA) 20 MG tablet Take 1 tablet (20 mg total) by mouth daily. Qty: 90 tablet, Refills: 1   Associated Diagnoses: Depression    Fluticasone Furoate-Vilanterol 100-25 MCG/INH AEPB Inhale 1 puff into the lungs daily. Dr Meredeth Ide    guaiFENesin (MUCINEX) 600 MG 12 hr tablet Take 1 tablet (600 mg total) by mouth 2 (two) times daily as needed for cough. Qty: 14 tablet, Refills: 0    ipratropium-albuterol (DUONEB) 0.5-2.5 (3) MG/3ML SOLN Take 3 mLs by nebulization every 6 (six) hours. Qty: 360 mL, Refills: 0    levothyroxine (SYNTHROID, LEVOTHROID) 88 MCG tablet Take 1 tablet (  88 mcg total) by mouth daily. Qty: 90 tablet, Refills: 1   Associated Diagnoses: Hypothyroidism, unspecified hypothyroidism type    lisinopril (PRINIVIL,ZESTRIL) 5 MG tablet Take 1 tablet (5 mg total) by mouth daily. Dr Gillis Ends: 90 tablet, Refills: 1   Associated Diagnoses: Essential hypertension    Multiple Vitamin (MULTIVITAMIN) tablet Take 1 tablet by mouth daily.    Omega-3 Fatty Acids (FISH OIL) 1000 MG CPDR Take 3 capsules by mouth daily. Qty: 90 capsule, Refills: 3   Associated Diagnoses: Hyperlipemia    omeprazole  (PRILOSEC) 40 MG capsule Take 1 capsule (40 mg total) by mouth daily. Qty: 30 capsule, Refills: 3    tiotropium (SPIRIVA) 18 MCG inhalation capsule Place 1 capsule into inhaler and inhale daily. Dr Meredeth Ide    senna-docusate (SENOKOT-S) 8.6-50 MG per tablet Take 1 tablet by mouth daily.         DISCHARGE INSTRUCTIONS:    Follow with PMD in 1-2 weeks.  If you experience worsening of your admission symptoms, develop shortness of breath, life threatening emergency, suicidal or homicidal thoughts you must seek medical attention immediately by calling 911 or calling your MD immediately  if symptoms less severe.  You Must read complete instructions/literature along with all the possible adverse reactions/side effects for all the Medicines you take and that have been prescribed to you. Take any new Medicines after you have completely understood and accept all the possible adverse reactions/side effects.   Please note  You were cared for by a hospitalist during your hospital stay. If you have any questions about your discharge medications or the care you received while you were in the hospital after you are discharged, you can call the unit and asked to speak with the hospitalist on call if the hospitalist that took care of you is not available. Once you are discharged, your primary care physician will handle any further medical issues. Please note that NO REFILLS for any discharge medications will be authorized once you are discharged, as it is imperative that you return to your primary care physician (or establish a relationship with a primary care physician if you do not have one) for your aftercare needs so that they can reassess your need for medications and monitor your lab values.    Today   CHIEF COMPLAINT:   Chief Complaint  Patient presents with  . Shortness of Breath  . Anxiety    HISTORY OF PRESENT ILLNESS:  Tasha Howe  is a 76 y.o. female with a known history of COPD  on 2 1/2 L of oxygen all the time brought in because of shortness of breath getting progressively worse without improvement with nebulizers at home. Denies any chest pain, fever, lower extremity edema. According to patient's son he tells me that patient was anxious , as she could not catch her breath.   VITAL SIGNS:  Blood pressure 129/80, pulse 81, temperature 98.7 F (37.1 C), temperature source Oral, resp. rate 16, height 5' (1.524 m), weight 47.6 kg (105 lb), SpO2 92 %.  I/O:   Intake/Output Summary (Last 24 hours) at 09/29/16 1404 Last data filed at 09/29/16 1200  Gross per 24 hour  Intake              660 ml  Output              325 ml  Net              335 ml    PHYSICAL EXAMINATION:  GENERAL:  76 y.o.-year-old patient lying in the bed with no acute distress.  EYES: Pupils equal, round, reactive to light and accommodation. No scleral icterus. Extraocular muscles intact.  HEENT: Head atraumatic, normocephalic. Oropharynx and nasopharynx clear.  NECK:  Supple, no jugular venous distention. No thyroid enlargement, no tenderness.  LUNGS: Normal breath sounds bilaterally, no wheezing, rales,rhonchi or crepitation. No use of accessory muscles of respiration.  CARDIOVASCULAR: S1, S2 normal. No murmurs, rubs, or gallops.  ABDOMEN: Soft, non-tender, non-distended. Bowel sounds present. No organomegaly or mass.  EXTREMITIES: No pedal edema, cyanosis, or clubbing.  NEUROLOGIC: Cranial nerves II through XII are intact. Muscle strength 5/5 in all extremities. Sensation intact. Gait not checked.  PSYCHIATRIC: The patient is alert and oriented x 3.  SKIN: No obvious rash, lesion, or ulcer.   DATA REVIEW:   CBC  Recent Labs Lab 09/29/16 0553  WBC 10.2  HGB 13.3  HCT 38.5  PLT 224    Chemistries   Recent Labs Lab 09/29/16 0553  NA 134*  K 5.1  CL 101  CO2 28  GLUCOSE 114*  BUN 27*  CREATININE 0.77  CALCIUM 9.7    Cardiac Enzymes  Recent Labs Lab 09/28/16 1110   TROPONINI <0.03    Microbiology Results  Results for orders placed or performed during the hospital encounter of 06/05/16  Blood culture (routine x 2)     Status: None   Collection Time: 06/05/16 10:06 AM  Result Value Ref Range Status   Specimen Description BLOOD RIGHT ARM  Final   Special Requests AER 8CC ANA 8CC  Final   Culture NO GROWTH 5 DAYS  Final   Report Status 06/10/2016 FINAL  Final  Blood culture (routine x 2)     Status: None   Collection Time: 06/05/16 10:13 AM  Result Value Ref Range Status   Specimen Description BLOOD LEFT HAND  Final   Special Requests NONE  Final   Culture NO GROWTH 5 DAYS  Final   Report Status 06/10/2016 FINAL  Final    RADIOLOGY:  Dg Chest 2 View  Result Date: 09/28/2016 CLINICAL DATA:  Shortness of breath starting this morning EXAM: CHEST  2 VIEW COMPARISON:  06/05/2016 FINDINGS: Cardiomediastinal silhouette is stable. Hyperinflation and chronic emphysematous changes again noted. There is streaky right base medially atelectasis or infiltrate. Mild thoracic dextroscoliosis. Diffuse thoracic spine osteopenia. Multiple moderate compression deformities are noted mid thoracic spine. Moderate compression deformity probable T11 vertebral body. This are of indeterminate age. Clinical correlation is necessary. Left base retrocardiac atelectasis or infiltrate seen on lateral view. IMPRESSION: Again noted hyperinflation and chronic interstitial changes. There is right base medially streaky atelectasis or infiltrate. Left base retrocardiac atelectasis or infiltrate. Multiple compression deformities are noted mid thoracic spine and lower thoracic spine of indeterminate age. Clinical correlation is necessary. Diffuse osteopenia thoracic spine. Mild thoracic dextroscoliosis Electronically Signed   By: Natasha MeadLiviu  Pop M.D.   On: 09/28/2016 13:45    EKG:   Orders placed or performed during the hospital encounter of 09/28/16  . ED EKG  . ED EKG      Management  plans discussed with the patient, family and they are in agreement.  CODE STATUS:     Code Status Orders        Start     Ordered   09/28/16 1430  Full code  Continuous     09/28/16 1431    Code Status History    Date Active Date Inactive Code Status  Order ID Comments User Context   06/05/2016 12:12 PM 06/07/2016  3:37 PM Full Code [id number]  Auburn Bilberry, MD Inpatient      TOTAL TIME TAKING CARE OF THIS PATIENT: 35 minutes.    Altamese Dilling M.D on 09/29/2016 at 2:04 PM  Between 7am to 6pm - Pager - [phone]  After 6pm go to [email or web address] - password [email or web address]  [facility] [place] [facility]  Office  [phone]  CC: Primary care physician; Elizabeth Sauer, MD   Note: This dictation was prepared with Dragon dictation along with smaller phrase technology. Any transcriptional errors that result from this process are unintentional.

## 2016-09-29 NOTE — Care Management (Addendum)
Physical therapy evaluation completed. Recommending Home Health and physical therapy in the home. Needs rolling walker. Text message  to Chinese CampBrad, [facility] representative for rolling walker. Telephone call to Kristopher OppenheimVickie Branson, [facility] representative for Home Health needs. [facility] unable to accept  Ms. Faeth. Will fax information to [facility].  Discharge to home today per Dr. Elisabeth PigeonVachhani. Son will transport. Gwenette GreetBrenda S Bryana Froemming RN MSN CCM Care Management

## 2016-09-29 NOTE — Care Management Obs Status (Signed)
MEDICARE OBSERVATION STATUS NOTIFICATION   Patient Details  Name: Tasha Howe MRN: [id number] Date of Birth: [birth year]   Medicare Observation Status Notification Given:  Yes    Gwenette GreetBrenda S Letonya Mangels, RN 09/29/2016, 9:10 AM

## 2016-09-29 NOTE — Evaluation (Signed)
Clinical/Bedside Swallow Evaluation Patient Details  Name: Tasha Howe MRN: [id number] Date of Birth: [birth year]  Today's Date: 09/29/2016 Time: SLP Start Time (ACUTE ONLY): 1200 SLP Stop Time (ACUTE ONLY): 1300 SLP Time Calculation (min) (ACUTE ONLY): 60 min  Past Medical History:  Past Medical History:  Diagnosis Date  . COPD (chronic obstructive pulmonary disease) (HCC)   . GERD (gastroesophageal reflux disease)   . Hyperlipidemia   . Hypertension   . Thyroid disease    Past Surgical History:  Past Surgical History:  Procedure Laterality Date  . HERNIA REPAIR  08/10/1989  . VAGINAL HYSTERECTOMY  08/10/1958  . vascular stent  08/10/1978   HPI:  Pt is a 76 y.o. female with a known history of COPD on 2 1/2 L of oxygen all the time brought in because of shortness of breath getting progressively worse without improvement with nebulizers at home. Denies any chest pain, fever, lower extremity edema. According to patient's son he tells me that patient was anxious , as she could not catch her breath. This morning, pt exhibited difficulty swallowing during breakfast meal when talking w/ visitor. Pt endorsed s/s of Reflux at home prior to hospitalization which have impacted her swallowing over the years per her description. She indicated feelings of pills not passing at times pointing to her strernal notch area.    Assessment / Plan / Recommendation Clinical Impression  Pt appeared to adequately tolerate trials of thin liquids and purees/softened solids w/ no immediate s/s of aspiration noted; pt was easily SOB w/ increased exertion of the tougher consistency of meats but using her precautions and strategies of small, single bites and alternating w/ small sips of liquids and rest breaks when needed, she appeared to manage her foods of her diet adequately. Pt was educated on general aspiration precautions and the strategies on alternating small sips/bites when taking po trials. No significant  oral phase deficits were noted; pt appeared to clear adequately b/t trials. Recommend continue her current diet as ordered; general aspiration precautions; pills in Puree (whole) if any difficulty swallowing w/ liquids and for easier swallowing. Briefly discussed Reflux and reflux precautions as pt's description of her s/s of Esophageal dysmotility appear similar to s/s reflux; handouts given. ST services will be available for further education and assessment as indicated while admitted.  SLP Visit Diagnosis: Dysphagia, oropharyngeal phase (R13.12);Dysphagia, pharyngoesophageal phase (R13.14)    Aspiration Risk   (reduced following aspiration and reflux precautions)    Diet Recommendation  Dysphagia level 3 - regular; thin liquids. General aspiration precautions; Reflux precautions.   Medication Administration: Whole meds with liquid (or Whole in Puree if easier for swallowing/clearing)    Other  Recommendations Recommended Consults: Consider GI evaluation (Dietician f/u) Oral Care Recommendations: Oral care BID;Patient independent with oral care   Follow up Recommendations None      Frequency and Duration            Prognosis Prognosis for Safe Diet Advancement: Good      Swallow Study   General Date of Onset: 09/28/16 HPI: Pt is a 76 y.o. female with a known history of COPD on 2 1/2 L of oxygen all the time brought in because of shortness of breath getting progressively worse without improvement with nebulizers at home. Denies any chest pain, fever, lower extremity edema. According to patient's son he tells me that patient was anxious , as she could not catch her breath. This morning, pt exhibited difficulty swallowing during breakfast meal when talking  w/ visitor. Pt endorsed s/s of Reflux at home prior to hospitalization which have impacted her swallowing over the years per her description. She indicated feelings of pills not passing at times pointing to her strernal notch area.  Type  of Study: Bedside Swallow Evaluation Previous Swallow Assessment: none noted Diet Prior to this Study: Regular;Thin liquids Temperature Spikes Noted: No (wbc not elevated) Respiratory Status: Nasal cannula (2.5-3 liters) History of Recent Intubation: No Behavior/Cognition: Alert;Cooperative;Pleasant mood Oral Cavity Assessment: Within Functional Limits Oral Care Completed by SLP: Recent completion by staff Oral Cavity - Dentition: Adequate natural dentition Vision: Functional for self-feeding Self-Feeding Abilities: Able to feed self Patient Positioning: Upright in bed (EOB) Baseline Vocal Quality: Normal (easily SOB w/ increased exertion) Volitional Cough: Strong Volitional Swallow: Able to elicit    Oral/Motor/Sensory Function Overall Oral Motor/Sensory Function: Within functional limits   Ice Chips Ice chips: Not tested   Thin Liquid Thin Liquid: Within functional limits Presentation: Cup;Self Fed (9-10 trials)    Nectar Thick Nectar Thick Liquid: Not tested   Honey Thick Honey Thick Liquid: Not tested   Puree Puree: Within functional limits Presentation: Self Fed;Spoon (5-6 trials)   Solid   GO   Solid: Within functional limits (cut small, broken down and moistened) Presentation: Self Fed;Spoon (5-6 trials) Other Comments: alternated w/ sips of liquids; small bites    Functional Assessment Tool Used: clinical judgement Functional Limitations: Swallowing Swallow Current Status ([id number]): At least 1 percent but less than 20 percent impaired, limited or restricted Swallow Goal Status [phone]): At least 1 percent but less than 20 percent impaired, limited or restricted Swallow Discharge Status [phone]): At least 1 percent but less than 20 percent impaired, limited or restricted     Tasha Som, Tasha Howe, Tasha Howe Tasha Howe 09/29/2016,2:38 PM

## 2016-09-30 NOTE — Care Management (Signed)
Received voice mail from [facility] representative. States that they are not able to see Ms. Ostrum due to staffing. [facility]. They  Are able to see Ms. Parr 10/08/16. [facility]. Telephone call to Ms. Kyla BalzarineCarmichael., Discussed that [facility] would be at her home 10/08/16. Agreed with this plan Gwenette GreetBrenda S Kashif Pooler RN MSN CCM Care Management

## 2016-10-01 DIAGNOSIS — J439 Emphysema, unspecified: Secondary | ICD-10-CM | POA: Diagnosis not present

## 2016-10-08 DIAGNOSIS — I10 Essential (primary) hypertension: Secondary | ICD-10-CM | POA: Diagnosis not present

## 2016-10-08 DIAGNOSIS — J441 Chronic obstructive pulmonary disease with (acute) exacerbation: Secondary | ICD-10-CM | POA: Diagnosis not present

## 2016-10-08 DIAGNOSIS — Z9981 Dependence on supplemental oxygen: Secondary | ICD-10-CM | POA: Diagnosis not present

## 2016-10-08 DIAGNOSIS — E785 Hyperlipidemia, unspecified: Secondary | ICD-10-CM | POA: Diagnosis not present

## 2016-10-08 DIAGNOSIS — K219 Gastro-esophageal reflux disease without esophagitis: Secondary | ICD-10-CM | POA: Diagnosis not present

## 2016-10-12 DIAGNOSIS — K219 Gastro-esophageal reflux disease without esophagitis: Secondary | ICD-10-CM | POA: Diagnosis not present

## 2016-10-12 DIAGNOSIS — E785 Hyperlipidemia, unspecified: Secondary | ICD-10-CM | POA: Diagnosis not present

## 2016-10-12 DIAGNOSIS — Z9981 Dependence on supplemental oxygen: Secondary | ICD-10-CM | POA: Diagnosis not present

## 2016-10-12 DIAGNOSIS — J441 Chronic obstructive pulmonary disease with (acute) exacerbation: Secondary | ICD-10-CM | POA: Diagnosis not present

## 2016-10-12 DIAGNOSIS — I10 Essential (primary) hypertension: Secondary | ICD-10-CM | POA: Diagnosis not present

## 2016-10-13 ENCOUNTER — Encounter: Payer: Self-pay | Admitting: Family Medicine

## 2016-10-13 ENCOUNTER — Inpatient Hospital Stay: Payer: Medicare Other | Admitting: Family Medicine

## 2016-10-13 ENCOUNTER — Ambulatory Visit (INDEPENDENT_AMBULATORY_CARE_PROVIDER_SITE_OTHER): Payer: Medicare Other | Admitting: Family Medicine

## 2016-10-13 VITALS — BP 120/70 | HR 88 | Ht 60.0 in | Wt 98.0 lb

## 2016-10-13 DIAGNOSIS — E039 Hypothyroidism, unspecified: Secondary | ICD-10-CM

## 2016-10-13 DIAGNOSIS — G8929 Other chronic pain: Secondary | ICD-10-CM

## 2016-10-13 DIAGNOSIS — F331 Major depressive disorder, recurrent, moderate: Secondary | ICD-10-CM

## 2016-10-13 DIAGNOSIS — K219 Gastro-esophageal reflux disease without esophagitis: Secondary | ICD-10-CM | POA: Diagnosis not present

## 2016-10-13 DIAGNOSIS — M545 Low back pain, unspecified: Secondary | ICD-10-CM

## 2016-10-13 DIAGNOSIS — J432 Centrilobular emphysema: Secondary | ICD-10-CM | POA: Insufficient documentation

## 2016-10-13 DIAGNOSIS — J449 Chronic obstructive pulmonary disease, unspecified: Secondary | ICD-10-CM | POA: Diagnosis not present

## 2016-10-13 DIAGNOSIS — Z09 Encounter for follow-up examination after completed treatment for conditions other than malignant neoplasm: Secondary | ICD-10-CM

## 2016-10-13 MED ORDER — OMEPRAZOLE 40 MG PO CPDR: 40.0000 mg | DELAYED_RELEASE_CAPSULE | Freq: Every day | ORAL | 3 refills | Status: DC

## 2016-10-13 MED ORDER — CITALOPRAM HYDROBROMIDE 20 MG PO TABS: 20.0000 mg | ORAL_TABLET | Freq: Every day | ORAL | 1 refills | Status: DC

## 2016-10-13 MED ORDER — LEVOTHYROXINE SODIUM 88 MCG PO TABS: 88.0000 ug | ORAL_TABLET | Freq: Every day | ORAL | 1 refills | Status: DC

## 2016-10-13 NOTE — Progress Notes (Signed)
Name: Tasha CaraMildred K Broers   MRN: [id number]    DOB: 05/05/1941   Date:10/13/2016       Progress Note  Subjective  Chief Complaint  Chief Complaint  Patient presents with  . Follow-up    was in hosp for 36 hrs under observation not admitted, due to pneumonia. Was d/c with antibiotics and inhalers. Has been doing breathing treatments q 6 hrs qhile awake at home.    Patient presents for 2 week follow up for admission 2 weeks ago.   Thyroid Problem  Presents for follow-up visit. Symptoms include cold intolerance, depressed mood, fatigue, heat intolerance and weight loss. Patient reports no anxiety, constipation, diaphoresis, diarrhea, hair loss, hoarse voice, leg swelling or palpitations.  Depression         This is a chronic problem.  The current episode started more than 1 year ago.   The onset quality is gradual.   The problem occurs intermittently.  The problem has been waxing and waning since onset.  Associated symptoms include fatigue.  Associated symptoms include no decreased concentration, no helplessness, no hopelessness, does not have insomnia, not irritable, no myalgias, no headaches, not sad and no suicidal ideas.  Past treatments include SSRIs - Selective serotonin reuptake inhibitors.  Compliance with treatment is good.  Previous treatment provided mild relief.  Past medical history includes thyroid problem.   Shortness of Breath  This is a chronic problem. The current episode started more than 1 year ago. The problem has been gradually improving. Pertinent negatives include no abdominal pain, chest pain, coryza, ear pain, fever, headaches, leg swelling, neck pain, orthopnea, PND, rash, sore throat, sputum production or wheezing. The symptoms are aggravated by pollens and URIs. Her past medical history is significant for COPD.  Hypertension  This is a chronic problem. The current episode started more than 1 year ago. The problem has been gradually improving since onset. Associated  symptoms include shortness of breath. Pertinent negatives include no blurred vision, chest pain, headaches, malaise/fatigue, neck pain, orthopnea, palpitations or PND. Identifiable causes of hypertension include a thyroid problem.    No problem-specific Assessment & Plan notes found for this encounter.   Past Medical History:  Diagnosis Date  . COPD (chronic obstructive pulmonary disease) (HCC)   . GERD (gastroesophageal reflux disease)   . Hyperlipidemia   . Hypertension   . Thyroid disease     Past Surgical History:  Procedure Laterality Date  . HERNIA REPAIR  08/10/1989  . VAGINAL HYSTERECTOMY  08/10/1958  . vascular stent  08/10/1978    No family history on file.  Social History   Social History  . Marital status: Divorced    Spouse name: N/A  . Number of children: N/A  . Years of education: N/A   Occupational History  . Not on file.   Social History Main Topics  . Smoking status: Former Smoker    Types: Cigarettes    Quit date: 08/11/2003  . Smokeless tobacco: Never Used  . Alcohol use No  . Drug use: No  . Sexual activity: Not Currently   Other Topics Concern  . Not on file   Social History Narrative  . No narrative on file    Allergies  Allergen Reactions  . Alendronate     Other reaction(s): Other (See Comments) GI Intolerance  . Sulfa Antibiotics     Outpatient Medications Prior to Visit  Medication Sig Dispense Refill  . albuterol (PROAIR HFA) 108 (90 Base) MCG/ACT inhaler Inhale 2 puffs  into the lungs every 6 (six) hours as needed for wheezing or shortness of breath.    . Ascorbic Acid (VITAMIN C) 500 MG CAPS Take 1 capsule by mouth daily.    Marland Kitchen aspirin 81 MG tablet Take 1 tablet by mouth daily.    . Calcium Carbonate-Vit D-Min (CALCIUM 600 + MINERALS PO) Take 1 tablet by mouth daily.    . Fluticasone Furoate-Vilanterol 100-25 MCG/INH AEPB Inhale 1 puff into the lungs daily. Dr Meredeth Ide    . Fluticasone-Salmeterol (ADVAIR DISKUS) 100-50 MCG/DOSE AEPB  Inhale 1 puff into the lungs 2 (two) times daily. 60 each 0  . ipratropium-albuterol (DUONEB) 0.5-2.5 (3) MG/3ML SOLN Take 3 mLs by nebulization every 6 (six) hours. 360 mL 0  . lisinopril (PRINIVIL,ZESTRIL) 5 MG tablet Take 1 tablet (5 mg total) by mouth daily. Dr Lady Gary 90 tablet 1  . Multiple Vitamin (MULTIVITAMIN) tablet Take 1 tablet by mouth daily.    . Omega-3 Fatty Acids (FISH OIL) 1000 MG CPDR Take 3 capsules by mouth daily. (Patient taking differently: Take 2 capsules by mouth daily. ) 90 capsule 3  . senna-docusate (SENOKOT-S) 8.6-50 MG per tablet Take 1 tablet by mouth daily.    Marland Kitchen tiotropium (SPIRIVA) 18 MCG inhalation capsule Place 1 capsule into inhaler and inhale daily. Dr Meredeth Ide    . citalopram (CELEXA) 20 MG tablet Take 1 tablet (20 mg total) by mouth daily. 90 tablet 1  . levothyroxine (SYNTHROID, LEVOTHROID) 88 MCG tablet Take 1 tablet (88 mcg total) by mouth daily. 90 tablet 1  . omeprazole (PRILOSEC) 40 MG capsule Take 1 capsule (40 mg total) by mouth daily. 30 capsule 3  . cefUROXime (CEFTIN) 250 MG tablet Take 1 tablet (250 mg total) by mouth 2 (two) times daily with a meal. 4 tablet 0  . guaiFENesin (MUCINEX) 600 MG 12 hr tablet Take 1 tablet (600 mg total) by mouth 2 (two) times daily as needed for cough. 14 tablet 0  . HYDROcodone-acetaminophen (NORCO) 5-325 MG tablet Take 1 tablet by mouth every 6 (six) hours as needed for moderate pain or severe pain. 15 tablet 0  . predniSONE (STERAPRED UNI-PAK 21 TAB) 10 MG (21) TBPK tablet Take 6 tabs first day, 5 tab on day 2, then 4 on day 3rd, 3 tabs on day 4th , 2 tab on day 5th, and 1 tab on 6th day. 21 tablet 0   No facility-administered medications prior to visit.     Review of Systems  Constitutional: Positive for fatigue and weight loss. Negative for chills, diaphoresis, fever and malaise/fatigue.  HENT: Negative for ear discharge, ear pain, hoarse voice and sore throat.   Eyes: Negative for blurred vision.  Respiratory:  Positive for shortness of breath. Negative for cough, sputum production and wheezing.   Cardiovascular: Negative for chest pain, palpitations, orthopnea, leg swelling and PND.  Gastrointestinal: Negative for abdominal pain, blood in stool, constipation, diarrhea, heartburn, melena and nausea.  Genitourinary: Negative for dysuria, frequency, hematuria and urgency.  Musculoskeletal: Negative for back pain, joint pain, myalgias and neck pain.  Skin: Negative for rash.  Neurological: Negative for dizziness, tingling, sensory change, focal weakness and headaches.  Endo/Heme/Allergies: Positive for cold intolerance and heat intolerance. Negative for environmental allergies and polydipsia. Does not bruise/bleed easily.  Psychiatric/Behavioral: Positive for depression. Negative for decreased concentration and suicidal ideas. The patient is not nervous/anxious and does not have insomnia.      Objective  Vitals:   10/13/16 1339  BP: 120/70  Pulse:  88  Weight: 98 lb (44.5 kg)  Height: 5' (1.524 m)    Physical Exam  Constitutional: She is well-developed, well-nourished, and in no distress. She is not irritable. No distress.  HENT:  Head: Normocephalic and atraumatic.  Right Ear: External ear normal.  Left Ear: External ear normal.  Nose: Nose normal.  Mouth/Throat: Oropharynx is clear and moist.  Eyes: Conjunctivae and EOM are normal. Pupils are equal, round, and reactive to light. Right eye exhibits no discharge. Left eye exhibits no discharge.  Neck: Normal range of motion. Neck supple. No JVD present. No thyromegaly present.  Cardiovascular: Normal rate, regular rhythm, normal heart sounds and intact distal pulses.  Exam reveals no gallop and no friction rub.   No murmur heard. Pulmonary/Chest: Effort normal and breath sounds normal. She has no wheezes. She has no rales.  Abdominal: Soft. Bowel sounds are normal. She exhibits no mass. There is no tenderness. There is no guarding.   Musculoskeletal: Normal range of motion. She exhibits no edema.  Lymphadenopathy:    She has no cervical adenopathy.  Neurological: She is alert. She has normal reflexes.  Skin: Skin is warm and dry. She is not diaphoretic.  Psychiatric: Mood and affect normal.      Assessment & Plan  Problem List Items Addressed This Visit      Digestive   Gastroesophageal reflux disease   Relevant Medications   omeprazole (PRILOSEC) 40 MG capsule     Endocrine   Hypothyroidism (acquired)   Relevant Medications   levothyroxine (SYNTHROID, LEVOTHROID) 88 MCG tablet   Other Relevant Orders   TSH     Other   Depression   Relevant Medications   citalopram (CELEXA) 20 MG tablet    Other Visit Diagnoses    Hospital discharge follow-up    -  Primary   Obstructive chronic bronchitis without exacerbation (HCC)       sample Breo 200   Relevant Orders   Ambulatory referral to Pulmonology   Chronic low back pain without sciatica, unspecified back pain laterality       Relevant Orders   Ambulatory referral to Orthopedic Surgery      Meds ordered this encounter  Medications  . citalopram (CELEXA) 20 MG tablet    Sig: Take 1 tablet (20 mg total) by mouth daily.    Dispense:  90 tablet    Refill:  1  . levothyroxine (SYNTHROID, LEVOTHROID) 88 MCG tablet    Sig: Take 1 tablet (88 mcg total) by mouth daily.    Dispense:  90 tablet    Refill:  1  . omeprazole (PRILOSEC) 40 MG capsule    Sig: Take 1 capsule (40 mg total) by mouth daily.    Dispense:  30 capsule    Refill:  3      Dr. Elizabeth Sauer [facility]  10/13/16

## 2016-10-14 DIAGNOSIS — E785 Hyperlipidemia, unspecified: Secondary | ICD-10-CM | POA: Diagnosis not present

## 2016-10-14 DIAGNOSIS — K219 Gastro-esophageal reflux disease without esophagitis: Secondary | ICD-10-CM | POA: Diagnosis not present

## 2016-10-14 DIAGNOSIS — I10 Essential (primary) hypertension: Secondary | ICD-10-CM | POA: Diagnosis not present

## 2016-10-14 DIAGNOSIS — Z9981 Dependence on supplemental oxygen: Secondary | ICD-10-CM | POA: Diagnosis not present

## 2016-10-14 DIAGNOSIS — J441 Chronic obstructive pulmonary disease with (acute) exacerbation: Secondary | ICD-10-CM | POA: Diagnosis not present

## 2016-10-14 LAB — TSH: TSH: 3.66 u[IU]/mL (ref 0.450–4.500)

## 2016-10-16 DIAGNOSIS — J441 Chronic obstructive pulmonary disease with (acute) exacerbation: Secondary | ICD-10-CM | POA: Diagnosis not present

## 2016-10-16 DIAGNOSIS — Z9981 Dependence on supplemental oxygen: Secondary | ICD-10-CM | POA: Diagnosis not present

## 2016-10-16 DIAGNOSIS — I10 Essential (primary) hypertension: Secondary | ICD-10-CM | POA: Diagnosis not present

## 2016-10-16 DIAGNOSIS — K219 Gastro-esophageal reflux disease without esophagitis: Secondary | ICD-10-CM | POA: Diagnosis not present

## 2016-10-16 DIAGNOSIS — E785 Hyperlipidemia, unspecified: Secondary | ICD-10-CM | POA: Diagnosis not present

## 2016-10-19 DIAGNOSIS — J449 Chronic obstructive pulmonary disease, unspecified: Secondary | ICD-10-CM | POA: Diagnosis not present

## 2016-10-20 DIAGNOSIS — Z9981 Dependence on supplemental oxygen: Secondary | ICD-10-CM | POA: Diagnosis not present

## 2016-10-20 DIAGNOSIS — I10 Essential (primary) hypertension: Secondary | ICD-10-CM | POA: Diagnosis not present

## 2016-10-20 DIAGNOSIS — K219 Gastro-esophageal reflux disease without esophagitis: Secondary | ICD-10-CM | POA: Diagnosis not present

## 2016-10-20 DIAGNOSIS — E785 Hyperlipidemia, unspecified: Secondary | ICD-10-CM | POA: Diagnosis not present

## 2016-10-20 DIAGNOSIS — J441 Chronic obstructive pulmonary disease with (acute) exacerbation: Secondary | ICD-10-CM | POA: Diagnosis not present

## 2016-10-21 DIAGNOSIS — K219 Gastro-esophageal reflux disease without esophagitis: Secondary | ICD-10-CM | POA: Diagnosis not present

## 2016-10-21 DIAGNOSIS — I10 Essential (primary) hypertension: Secondary | ICD-10-CM | POA: Diagnosis not present

## 2016-10-21 DIAGNOSIS — J441 Chronic obstructive pulmonary disease with (acute) exacerbation: Secondary | ICD-10-CM | POA: Diagnosis not present

## 2016-10-21 DIAGNOSIS — Z9981 Dependence on supplemental oxygen: Secondary | ICD-10-CM | POA: Diagnosis not present

## 2016-10-21 DIAGNOSIS — E785 Hyperlipidemia, unspecified: Secondary | ICD-10-CM | POA: Diagnosis not present

## 2016-10-22 DIAGNOSIS — K219 Gastro-esophageal reflux disease without esophagitis: Secondary | ICD-10-CM | POA: Diagnosis not present

## 2016-10-22 DIAGNOSIS — E785 Hyperlipidemia, unspecified: Secondary | ICD-10-CM | POA: Diagnosis not present

## 2016-10-22 DIAGNOSIS — Z9981 Dependence on supplemental oxygen: Secondary | ICD-10-CM | POA: Diagnosis not present

## 2016-10-22 DIAGNOSIS — J441 Chronic obstructive pulmonary disease with (acute) exacerbation: Secondary | ICD-10-CM | POA: Diagnosis not present

## 2016-10-22 DIAGNOSIS — I10 Essential (primary) hypertension: Secondary | ICD-10-CM | POA: Diagnosis not present

## 2016-10-23 ENCOUNTER — Other Ambulatory Visit: Payer: Self-pay | Admitting: Unknown Physician Specialty

## 2016-10-23 DIAGNOSIS — S32030A Wedge compression fracture of third lumbar vertebra, initial encounter for closed fracture: Secondary | ICD-10-CM

## 2016-10-23 DIAGNOSIS — S32010A Wedge compression fracture of first lumbar vertebra, initial encounter for closed fracture: Secondary | ICD-10-CM | POA: Diagnosis not present

## 2016-10-23 DIAGNOSIS — M545 Low back pain: Secondary | ICD-10-CM | POA: Diagnosis not present

## 2016-10-24 ENCOUNTER — Ambulatory Visit
Admission: RE | Admit: 2016-10-24 | Discharge: 2016-10-24 | Disposition: A | Discharge status: Home / Self Care | Payer: Medicare Other | Source: Ambulatory Visit | Attending: Unknown Physician Specialty | Admitting: Unknown Physician Specialty

## 2016-10-24 DIAGNOSIS — M4856XA Collapsed vertebra, not elsewhere classified, lumbar region, initial encounter for fracture: Secondary | ICD-10-CM | POA: Diagnosis not present

## 2016-10-24 DIAGNOSIS — M5137 Other intervertebral disc degeneration, lumbosacral region: Secondary | ICD-10-CM | POA: Diagnosis not present

## 2016-10-24 DIAGNOSIS — M4854XA Collapsed vertebra, not elsewhere classified, thoracic region, initial encounter for fracture: Secondary | ICD-10-CM | POA: Diagnosis not present

## 2016-10-24 DIAGNOSIS — S32030A Wedge compression fracture of third lumbar vertebra, initial encounter for closed fracture: Secondary | ICD-10-CM

## 2016-10-24 DIAGNOSIS — M47816 Spondylosis without myelopathy or radiculopathy, lumbar region: Secondary | ICD-10-CM | POA: Diagnosis not present

## 2016-10-24 DIAGNOSIS — M5136 Other intervertebral disc degeneration, lumbar region: Secondary | ICD-10-CM | POA: Insufficient documentation

## 2016-10-24 DIAGNOSIS — M545 Low back pain: Secondary | ICD-10-CM | POA: Diagnosis not present

## 2016-10-24 DIAGNOSIS — N281 Cyst of kidney, acquired: Secondary | ICD-10-CM | POA: Diagnosis not present

## 2016-10-24 DIAGNOSIS — S32010A Wedge compression fracture of first lumbar vertebra, initial encounter for closed fracture: Secondary | ICD-10-CM | POA: Diagnosis present

## 2016-10-27 DIAGNOSIS — I10 Essential (primary) hypertension: Secondary | ICD-10-CM | POA: Diagnosis not present

## 2016-10-27 DIAGNOSIS — K219 Gastro-esophageal reflux disease without esophagitis: Secondary | ICD-10-CM | POA: Diagnosis not present

## 2016-10-27 DIAGNOSIS — E785 Hyperlipidemia, unspecified: Secondary | ICD-10-CM | POA: Diagnosis not present

## 2016-10-27 DIAGNOSIS — J441 Chronic obstructive pulmonary disease with (acute) exacerbation: Secondary | ICD-10-CM | POA: Diagnosis not present

## 2016-10-27 DIAGNOSIS — J439 Emphysema, unspecified: Secondary | ICD-10-CM | POA: Diagnosis not present

## 2016-10-27 DIAGNOSIS — Z9981 Dependence on supplemental oxygen: Secondary | ICD-10-CM | POA: Diagnosis not present

## 2016-10-28 DIAGNOSIS — K219 Gastro-esophageal reflux disease without esophagitis: Secondary | ICD-10-CM | POA: Diagnosis not present

## 2016-10-28 DIAGNOSIS — E785 Hyperlipidemia, unspecified: Secondary | ICD-10-CM | POA: Diagnosis not present

## 2016-10-28 DIAGNOSIS — I10 Essential (primary) hypertension: Secondary | ICD-10-CM | POA: Diagnosis not present

## 2016-10-28 DIAGNOSIS — Z9981 Dependence on supplemental oxygen: Secondary | ICD-10-CM | POA: Diagnosis not present

## 2016-10-28 DIAGNOSIS — J441 Chronic obstructive pulmonary disease with (acute) exacerbation: Secondary | ICD-10-CM | POA: Diagnosis not present

## 2016-10-29 DIAGNOSIS — J441 Chronic obstructive pulmonary disease with (acute) exacerbation: Secondary | ICD-10-CM | POA: Diagnosis not present

## 2016-10-29 DIAGNOSIS — E785 Hyperlipidemia, unspecified: Secondary | ICD-10-CM | POA: Diagnosis not present

## 2016-10-29 DIAGNOSIS — K219 Gastro-esophageal reflux disease without esophagitis: Secondary | ICD-10-CM | POA: Diagnosis not present

## 2016-10-29 DIAGNOSIS — I10 Essential (primary) hypertension: Secondary | ICD-10-CM | POA: Diagnosis not present

## 2016-10-29 DIAGNOSIS — Z9981 Dependence on supplemental oxygen: Secondary | ICD-10-CM | POA: Diagnosis not present

## 2016-11-02 DIAGNOSIS — Z9981 Dependence on supplemental oxygen: Secondary | ICD-10-CM | POA: Diagnosis not present

## 2016-11-02 DIAGNOSIS — K219 Gastro-esophageal reflux disease without esophagitis: Secondary | ICD-10-CM | POA: Diagnosis not present

## 2016-11-02 DIAGNOSIS — E785 Hyperlipidemia, unspecified: Secondary | ICD-10-CM | POA: Diagnosis not present

## 2016-11-02 DIAGNOSIS — J441 Chronic obstructive pulmonary disease with (acute) exacerbation: Secondary | ICD-10-CM | POA: Diagnosis not present

## 2016-11-02 DIAGNOSIS — I10 Essential (primary) hypertension: Secondary | ICD-10-CM | POA: Diagnosis not present

## 2016-11-04 DIAGNOSIS — J441 Chronic obstructive pulmonary disease with (acute) exacerbation: Secondary | ICD-10-CM | POA: Diagnosis not present

## 2016-11-04 DIAGNOSIS — K219 Gastro-esophageal reflux disease without esophagitis: Secondary | ICD-10-CM | POA: Diagnosis not present

## 2016-11-04 DIAGNOSIS — E785 Hyperlipidemia, unspecified: Secondary | ICD-10-CM | POA: Diagnosis not present

## 2016-11-04 DIAGNOSIS — Z9981 Dependence on supplemental oxygen: Secondary | ICD-10-CM | POA: Diagnosis not present

## 2016-11-04 DIAGNOSIS — I10 Essential (primary) hypertension: Secondary | ICD-10-CM | POA: Diagnosis not present

## 2016-11-05 DIAGNOSIS — I10 Essential (primary) hypertension: Secondary | ICD-10-CM | POA: Diagnosis not present

## 2016-11-05 DIAGNOSIS — Z9981 Dependence on supplemental oxygen: Secondary | ICD-10-CM | POA: Diagnosis not present

## 2016-11-05 DIAGNOSIS — K219 Gastro-esophageal reflux disease without esophagitis: Secondary | ICD-10-CM | POA: Diagnosis not present

## 2016-11-05 DIAGNOSIS — E785 Hyperlipidemia, unspecified: Secondary | ICD-10-CM | POA: Diagnosis not present

## 2016-11-05 DIAGNOSIS — J441 Chronic obstructive pulmonary disease with (acute) exacerbation: Secondary | ICD-10-CM | POA: Diagnosis not present

## 2016-11-10 DIAGNOSIS — J449 Chronic obstructive pulmonary disease, unspecified: Secondary | ICD-10-CM | POA: Diagnosis not present

## 2016-11-10 DIAGNOSIS — R0602 Shortness of breath: Secondary | ICD-10-CM | POA: Diagnosis not present

## 2016-11-11 DIAGNOSIS — J441 Chronic obstructive pulmonary disease with (acute) exacerbation: Secondary | ICD-10-CM | POA: Diagnosis not present

## 2016-11-11 DIAGNOSIS — E785 Hyperlipidemia, unspecified: Secondary | ICD-10-CM | POA: Diagnosis not present

## 2016-11-11 DIAGNOSIS — I10 Essential (primary) hypertension: Secondary | ICD-10-CM | POA: Diagnosis not present

## 2016-11-11 DIAGNOSIS — K219 Gastro-esophageal reflux disease without esophagitis: Secondary | ICD-10-CM | POA: Diagnosis not present

## 2016-11-11 DIAGNOSIS — Z9981 Dependence on supplemental oxygen: Secondary | ICD-10-CM | POA: Diagnosis not present

## 2016-11-12 ENCOUNTER — Other Ambulatory Visit: Payer: Self-pay

## 2016-11-12 DIAGNOSIS — E785 Hyperlipidemia, unspecified: Secondary | ICD-10-CM | POA: Diagnosis not present

## 2016-11-12 DIAGNOSIS — I10 Essential (primary) hypertension: Secondary | ICD-10-CM | POA: Diagnosis not present

## 2016-11-12 DIAGNOSIS — J301 Allergic rhinitis due to pollen: Secondary | ICD-10-CM

## 2016-11-12 DIAGNOSIS — J441 Chronic obstructive pulmonary disease with (acute) exacerbation: Secondary | ICD-10-CM | POA: Diagnosis not present

## 2016-11-12 DIAGNOSIS — Z9981 Dependence on supplemental oxygen: Secondary | ICD-10-CM | POA: Diagnosis not present

## 2016-11-12 DIAGNOSIS — K219 Gastro-esophageal reflux disease without esophagitis: Secondary | ICD-10-CM | POA: Diagnosis not present

## 2016-11-12 MED ORDER — LORATADINE 10 MG PO TABS: 10.0000 mg | ORAL_TABLET | Freq: Every day | ORAL | 0 refills | Status: DC

## 2016-11-17 DIAGNOSIS — I10 Essential (primary) hypertension: Secondary | ICD-10-CM | POA: Diagnosis not present

## 2016-11-17 DIAGNOSIS — K219 Gastro-esophageal reflux disease without esophagitis: Secondary | ICD-10-CM | POA: Diagnosis not present

## 2016-11-17 DIAGNOSIS — E785 Hyperlipidemia, unspecified: Secondary | ICD-10-CM | POA: Diagnosis not present

## 2016-11-17 DIAGNOSIS — J441 Chronic obstructive pulmonary disease with (acute) exacerbation: Secondary | ICD-10-CM | POA: Diagnosis not present

## 2016-11-17 DIAGNOSIS — Z9981 Dependence on supplemental oxygen: Secondary | ICD-10-CM | POA: Diagnosis not present

## 2016-11-19 DIAGNOSIS — J449 Chronic obstructive pulmonary disease, unspecified: Secondary | ICD-10-CM | POA: Diagnosis not present

## 2016-11-19 DIAGNOSIS — E785 Hyperlipidemia, unspecified: Secondary | ICD-10-CM | POA: Diagnosis not present

## 2016-11-19 DIAGNOSIS — J441 Chronic obstructive pulmonary disease with (acute) exacerbation: Secondary | ICD-10-CM | POA: Diagnosis not present

## 2016-11-19 DIAGNOSIS — K219 Gastro-esophageal reflux disease without esophagitis: Secondary | ICD-10-CM | POA: Diagnosis not present

## 2016-11-19 DIAGNOSIS — I10 Essential (primary) hypertension: Secondary | ICD-10-CM | POA: Diagnosis not present

## 2016-11-19 DIAGNOSIS — Z9981 Dependence on supplemental oxygen: Secondary | ICD-10-CM | POA: Diagnosis not present

## 2016-11-24 DIAGNOSIS — E785 Hyperlipidemia, unspecified: Secondary | ICD-10-CM | POA: Diagnosis not present

## 2016-11-24 DIAGNOSIS — Z9981 Dependence on supplemental oxygen: Secondary | ICD-10-CM | POA: Diagnosis not present

## 2016-11-24 DIAGNOSIS — J441 Chronic obstructive pulmonary disease with (acute) exacerbation: Secondary | ICD-10-CM | POA: Diagnosis not present

## 2016-11-24 DIAGNOSIS — K219 Gastro-esophageal reflux disease without esophagitis: Secondary | ICD-10-CM | POA: Diagnosis not present

## 2016-11-24 DIAGNOSIS — I10 Essential (primary) hypertension: Secondary | ICD-10-CM | POA: Diagnosis not present

## 2016-11-30 DIAGNOSIS — S32030K Wedge compression fracture of third lumbar vertebra, subsequent encounter for fracture with nonunion: Secondary | ICD-10-CM | POA: Diagnosis not present

## 2016-11-30 DIAGNOSIS — S32010K Wedge compression fracture of first lumbar vertebra, subsequent encounter for fracture with nonunion: Secondary | ICD-10-CM | POA: Diagnosis not present

## 2016-12-02 ENCOUNTER — Encounter
Admission: RE | Admit: 2016-12-02 | Discharge: 2016-12-02 | Disposition: A | Discharge status: Home / Self Care | Payer: Medicare Other | Source: Ambulatory Visit | Attending: Orthopedic Surgery | Admitting: Orthopedic Surgery

## 2016-12-02 DIAGNOSIS — I358 Other nonrheumatic aortic valve disorders: Secondary | ICD-10-CM | POA: Diagnosis not present

## 2016-12-02 DIAGNOSIS — K219 Gastro-esophageal reflux disease without esophagitis: Secondary | ICD-10-CM | POA: Diagnosis not present

## 2016-12-02 DIAGNOSIS — J449 Chronic obstructive pulmonary disease, unspecified: Secondary | ICD-10-CM | POA: Diagnosis not present

## 2016-12-02 DIAGNOSIS — E039 Hypothyroidism, unspecified: Secondary | ICD-10-CM | POA: Diagnosis not present

## 2016-12-02 DIAGNOSIS — Z8249 Family history of ischemic heart disease and other diseases of the circulatory system: Secondary | ICD-10-CM | POA: Diagnosis not present

## 2016-12-02 DIAGNOSIS — Z833 Family history of diabetes mellitus: Secondary | ICD-10-CM | POA: Diagnosis not present

## 2016-12-02 DIAGNOSIS — Z841 Family history of disorders of kidney and ureter: Secondary | ICD-10-CM | POA: Diagnosis not present

## 2016-12-02 DIAGNOSIS — M810 Age-related osteoporosis without current pathological fracture: Secondary | ICD-10-CM | POA: Diagnosis not present

## 2016-12-02 DIAGNOSIS — Z7982 Long term (current) use of aspirin: Secondary | ICD-10-CM | POA: Diagnosis not present

## 2016-12-02 DIAGNOSIS — Z888 Allergy status to other drugs, medicaments and biological substances status: Secondary | ICD-10-CM | POA: Diagnosis not present

## 2016-12-02 DIAGNOSIS — Z79899 Other long term (current) drug therapy: Secondary | ICD-10-CM | POA: Diagnosis not present

## 2016-12-02 DIAGNOSIS — F329 Major depressive disorder, single episode, unspecified: Secondary | ICD-10-CM | POA: Diagnosis not present

## 2016-12-02 DIAGNOSIS — Z882 Allergy status to sulfonamides status: Secondary | ICD-10-CM | POA: Diagnosis not present

## 2016-12-02 DIAGNOSIS — Z9981 Dependence on supplemental oxygen: Secondary | ICD-10-CM | POA: Diagnosis not present

## 2016-12-02 DIAGNOSIS — M4856XA Collapsed vertebra, not elsewhere classified, lumbar region, initial encounter for fracture: Secondary | ICD-10-CM | POA: Diagnosis not present

## 2016-12-02 DIAGNOSIS — I10 Essential (primary) hypertension: Secondary | ICD-10-CM | POA: Diagnosis not present

## 2016-12-02 HISTORY — DX: Tremor, unspecified: R25.1

## 2016-12-02 HISTORY — DX: Major depressive disorder, single episode, unspecified: F32.9

## 2016-12-02 HISTORY — DX: Depression, unspecified: F32.A

## 2016-12-02 HISTORY — DX: Congenital malformation of aorta unspecified: Q25.40

## 2016-12-02 HISTORY — DX: Hypothyroidism, unspecified: E03.9

## 2016-12-02 MED ORDER — CEFAZOLIN SODIUM-DEXTROSE 1-4 GM/50ML-% IV SOLN
1.0000 g | Freq: Once | INTRAVENOUS | Status: AC
  Administered 2016-12-03: 1 g via INTRAVENOUS

## 2016-12-02 NOTE — Patient Instructions (Signed)
  Your procedure is scheduled on: 12/03/16  1200 NOON Report to Day Surgery. [facility]OND FLOOR .  Remember: Instructions that are not followed completely may result in serious medical risk, up to and including death, or upon the discretion of your surgeon and anesthesiologist your surgery may need to be rescheduled.    _X___ 1. Do not eat food or drink liquids after midnight. No gum chewing or hard candies.     ____ 2. No Alcohol for 24 hours before or after surgery.   ____ 3. Do Not Smoke For 24 Hours Prior to Your Surgery.   ____ 4. Bring all medications with you on the day of surgery if instructed.    __X__ 5. Notify your doctor if there is any change in your medical condition     (cold, fever, infections).       Do not wear jewelry, make-up, hairpins, clips or nail polish.  Do not wear lotions, powders, or perfumes. You may wear deodorant.  Do not shave 48 hours prior to surgery. Men may shave face and neck.  Do not bring valuables to the hospital.    [facility] is not responsible for any belongings or valuables.               Contacts, dentures or bridgework may not be worn into surgery.  Leave your suitcase in the car. After surgery it may be brought to your room.  For patients admitted to the hospital, discharge time is determined by your                treatment team.   Patients discharged the day of surgery will not be allowed to drive home.   Please read over the following fact sheets that you were given:   CHG   __X__ Take these medicines the morning of surgery with A SIP OF WATER:    1. CELEXA  2. LEVOTHYROXINE  3. LISINOPRIL  4.OMEPRAZOLE  5.  6.  ____ Fleet Enema (as directed)   _X___ Use CHG Soap as directed  ____ Use inhalers on the day of surgery  ____ Stop metformin 2 days prior to surgery    ____ Take 1/2 of usual insulin dose the night before surgery and none on the morning of surgery.   ____ Stop Coumadin/Plavix/aspirin on  ____ Stop  Anti-inflammatories on    __X__ Stop supplements until after surgery.     NO MORE VIT E AND FISH OIL TODAY  ____ Bring C-Pap to the hospital.

## 2016-12-02 NOTE — Pre-Procedure Instructions (Signed)
SPOKE WITH DR Priscella Mann RE PATIENT HISTORY. LAST NOTES FROM DR Meredeth Ide AND DR FATH ON CHART. OK TO PROCEED

## 2016-12-03 ENCOUNTER — Ambulatory Visit
Admission: RE | Admit: 2016-12-03 | Discharge: 2016-12-03 | Disposition: A | Discharge status: Home / Self Care | Payer: Medicare Other | Source: Ambulatory Visit | Attending: Orthopedic Surgery | Admitting: Orthopedic Surgery

## 2016-12-03 ENCOUNTER — Ambulatory Visit: Payer: Medicare Other | Admitting: Anesthesiology

## 2016-12-03 ENCOUNTER — Ambulatory Visit: Payer: Medicare Other

## 2016-12-03 ENCOUNTER — Encounter
Admission: RE | Disposition: A | Discharge status: Home / Self Care | Payer: Self-pay | Source: Ambulatory Visit | Attending: Orthopedic Surgery

## 2016-12-03 DIAGNOSIS — Z419 Encounter for procedure for purposes other than remedying health state, unspecified: Secondary | ICD-10-CM

## 2016-12-03 DIAGNOSIS — Z882 Allergy status to sulfonamides status: Secondary | ICD-10-CM | POA: Insufficient documentation

## 2016-12-03 DIAGNOSIS — Z79899 Other long term (current) drug therapy: Secondary | ICD-10-CM | POA: Diagnosis not present

## 2016-12-03 DIAGNOSIS — I10 Essential (primary) hypertension: Secondary | ICD-10-CM | POA: Insufficient documentation

## 2016-12-03 DIAGNOSIS — Z888 Allergy status to other drugs, medicaments and biological substances status: Secondary | ICD-10-CM | POA: Diagnosis not present

## 2016-12-03 DIAGNOSIS — E039 Hypothyroidism, unspecified: Secondary | ICD-10-CM | POA: Diagnosis not present

## 2016-12-03 DIAGNOSIS — M810 Age-related osteoporosis without current pathological fracture: Secondary | ICD-10-CM | POA: Diagnosis not present

## 2016-12-03 DIAGNOSIS — Z841 Family history of disorders of kidney and ureter: Secondary | ICD-10-CM | POA: Diagnosis not present

## 2016-12-03 DIAGNOSIS — S32030K Wedge compression fracture of third lumbar vertebra, subsequent encounter for fracture with nonunion: Secondary | ICD-10-CM | POA: Diagnosis not present

## 2016-12-03 DIAGNOSIS — Z8249 Family history of ischemic heart disease and other diseases of the circulatory system: Secondary | ICD-10-CM | POA: Diagnosis not present

## 2016-12-03 DIAGNOSIS — Z833 Family history of diabetes mellitus: Secondary | ICD-10-CM | POA: Diagnosis not present

## 2016-12-03 DIAGNOSIS — J449 Chronic obstructive pulmonary disease, unspecified: Secondary | ICD-10-CM | POA: Insufficient documentation

## 2016-12-03 DIAGNOSIS — I358 Other nonrheumatic aortic valve disorders: Secondary | ICD-10-CM | POA: Insufficient documentation

## 2016-12-03 DIAGNOSIS — M4856XA Collapsed vertebra, not elsewhere classified, lumbar region, initial encounter for fracture: Secondary | ICD-10-CM | POA: Diagnosis not present

## 2016-12-03 DIAGNOSIS — Z7982 Long term (current) use of aspirin: Secondary | ICD-10-CM | POA: Insufficient documentation

## 2016-12-03 DIAGNOSIS — Z9981 Dependence on supplemental oxygen: Secondary | ICD-10-CM | POA: Diagnosis not present

## 2016-12-03 DIAGNOSIS — K219 Gastro-esophageal reflux disease without esophagitis: Secondary | ICD-10-CM | POA: Insufficient documentation

## 2016-12-03 DIAGNOSIS — S32010K Wedge compression fracture of first lumbar vertebra, subsequent encounter for fracture with nonunion: Secondary | ICD-10-CM | POA: Diagnosis not present

## 2016-12-03 DIAGNOSIS — F329 Major depressive disorder, single episode, unspecified: Secondary | ICD-10-CM | POA: Insufficient documentation

## 2016-12-03 DIAGNOSIS — R937 Abnormal findings on diagnostic imaging of other parts of musculoskeletal system: Secondary | ICD-10-CM | POA: Diagnosis not present

## 2016-12-03 HISTORY — PX: KYPHOPLASTY: SHX5884

## 2016-12-03 SURGERY — KYPHOPLASTY
Anesthesia: Monitor Anesthesia Care | Site: Back | Wound class: Clean

## 2016-12-03 MED ORDER — ONDANSETRON HCL 4 MG PO TABS: 4.0000 mg | ORAL_TABLET | Freq: Four times a day (QID) | ORAL | Status: DC | PRN

## 2016-12-03 MED ORDER — LIDOCAINE HCL (PF) 1 % IJ SOLN
INTRAMUSCULAR | Status: AC
  Filled 2016-12-03: qty 30

## 2016-12-03 MED ORDER — LACTATED RINGERS IV SOLN
INTRAVENOUS | Status: DC
  Administered 2016-12-03: 15:00:00 via INTRAVENOUS

## 2016-12-03 MED ORDER — BUPIVACAINE-EPINEPHRINE (PF) 0.5% -1:200000 IJ SOLN
INTRAMUSCULAR | Status: AC
  Filled 2016-12-03: qty 30

## 2016-12-03 MED ORDER — FENTANYL CITRATE (PF) 100 MCG/2ML IJ SOLN: 25.0000 ug | INTRAMUSCULAR | Status: DC | PRN

## 2016-12-03 MED ORDER — ONDANSETRON HCL 4 MG/2ML IJ SOLN
INTRAMUSCULAR | Status: AC
  Filled 2016-12-03: qty 2

## 2016-12-03 MED ORDER — ONDANSETRON HCL 4 MG/2ML IJ SOLN
4.0000 mg | Freq: Four times a day (QID) | INTRAMUSCULAR | Status: DC | PRN
  Administered 2016-12-03: 4 mg via INTRAVENOUS

## 2016-12-03 MED ORDER — CEFAZOLIN SODIUM-DEXTROSE 1-4 GM/50ML-% IV SOLN
INTRAVENOUS | Status: AC
  Filled 2016-12-03: qty 50

## 2016-12-03 MED ORDER — PROPOFOL 10 MG/ML IV BOLUS
INTRAVENOUS | Status: DC | PRN
  Administered 2016-12-03: 10 mg via INTRAVENOUS
  Administered 2016-12-03: 20 mg via INTRAVENOUS
  Administered 2016-12-03: 10 mg via INTRAVENOUS

## 2016-12-03 MED ORDER — LIDOCAINE HCL 1 % IJ SOLN
INTRAMUSCULAR | Status: DC | PRN
  Administered 2016-12-03: 35 mL

## 2016-12-03 MED ORDER — HYDROCODONE-ACETAMINOPHEN 5-325 MG PO TABS: 1.0000 | ORAL_TABLET | Freq: Four times a day (QID) | ORAL | 0 refills | Status: DC | PRN

## 2016-12-03 MED ORDER — FENTANYL CITRATE (PF) 100 MCG/2ML IJ SOLN
INTRAMUSCULAR | Status: DC | PRN
  Administered 2016-12-03: 25 ug via INTRAVENOUS

## 2016-12-03 MED ORDER — IOPAMIDOL (ISOVUE-M 200) INJECTION 41%
INTRAMUSCULAR | Status: DC | PRN
  Administered 2016-12-03: 10 mL

## 2016-12-03 MED ORDER — BUPIVACAINE-EPINEPHRINE (PF) 0.5% -1:200000 IJ SOLN
INTRAMUSCULAR | Status: DC | PRN
  Administered 2016-12-03: 35 mL

## 2016-12-03 MED ORDER — IOPAMIDOL (ISOVUE-M 200) INJECTION 41%
INTRAMUSCULAR | Status: AC
Start: 2016-12-03 — End: ?
  Filled 2016-12-03: qty 20

## 2016-12-03 MED ORDER — FENTANYL CITRATE (PF) 100 MCG/2ML IJ SOLN
INTRAMUSCULAR | Status: AC
  Filled 2016-12-03: qty 2

## 2016-12-03 MED ORDER — PROPOFOL 10 MG/ML IV BOLUS
INTRAVENOUS | Status: AC
  Filled 2016-12-03: qty 20

## 2016-12-03 SURGICAL SUPPLY — 18 items
CEMENT BONE KYPHON CDS (Cement) ×3 IMPLANT
CEMENT KYPHON CX01A KIT/MIXER (Cement) ×3 IMPLANT
DERMABOND ADVANCED (GAUZE/BANDAGES/DRESSINGS) ×2
DERMABOND ADVANCED .7 DNX12 (GAUZE/BANDAGES/DRESSINGS) ×1 IMPLANT
DEVICE BIOPSY BONE KYPHX (INSTRUMENTS) ×3 IMPLANT
DRAPE C-ARM XRAY 36X54 (DRAPES) ×3 IMPLANT
DURAPREP 26ML APPLICATOR (WOUND CARE) ×3 IMPLANT
GLOVE SURG SYN 9.0  PF PI (GLOVE) ×2
GLOVE SURG SYN 9.0 PF PI (GLOVE) ×1 IMPLANT
GOWN SRG 2XL LVL 4 RGLN SLV (GOWNS) ×1 IMPLANT
GOWN STRL NON-REIN 2XL LVL4 (GOWNS) ×2
GOWN STRL REUS W/ TWL LRG LVL3 (GOWN DISPOSABLE) ×1 IMPLANT
GOWN STRL REUS W/TWL LRG LVL3 (GOWN DISPOSABLE) ×2
PACK KYPHOPLASTY (MISCELLANEOUS) ×3 IMPLANT
STRAP SAFETY BODY (MISCELLANEOUS) ×3 IMPLANT
TRAY KYPHOPAK 15/2 EXPRESS (KITS) ×3 IMPLANT
TRAY KYPHOPAK 15/3 EXPRESS 1ST (MISCELLANEOUS) IMPLANT
TRAY KYPHOPAK 20/3 EXPRESS 1ST (MISCELLANEOUS) IMPLANT

## 2016-12-03 NOTE — Anesthesia Preprocedure Evaluation (Signed)
Anesthesia Evaluation  Patient identified by MRN, date of birth, ID band Patient awake    Reviewed: Allergy & Precautions, H&P , NPO status , Patient's Chart, lab work & pertinent test results, reviewed documented beta blocker date and time   History of Anesthesia Complications Negative for: history of anesthetic complications  Airway Mallampati: II  TM Distance: >3 FB Neck ROM: full    Dental  (+) Upper Dentures, Edentulous Upper, Missing   Pulmonary shortness of breath and with exertion, neg sleep apnea, COPD,  COPD inhaler and oxygen dependent, neg recent URI, former smoker,           Cardiovascular Exercise Tolerance: Good hypertension, (-) angina+ Peripheral Vascular Disease  (-) CAD, (-) Past MI, (-) Cardiac Stents and (-) CABG (-) dysrhythmias + Valvular Problems/Murmurs      Neuro/Psych PSYCHIATRIC DISORDERS (Depression) negative neurological ROS     GI/Hepatic Neg liver ROS, GERD  ,  Endo/Other  neg diabetesHypothyroidism   Renal/GU negative Renal ROS  negative genitourinary   Musculoskeletal   Abdominal   Peds  Hematology negative hematology ROS (+)   Anesthesia Other Findings Past Medical History: No date: Aortic anomaly     Comment: VALVE DISEASE No date: COPD (chronic obstructive pulmonary disease) (*     Comment: SEVERE  No date: Depression No date: GERD (gastroesophageal reflux disease) No date: Hyperlipidemia No date: Hypertension No date: Hypothyroidism No date: Thyroid disease No date: Tremors of nervous system   Reproductive/Obstetrics negative OB ROS                             Anesthesia Physical Anesthesia Plan  ASA: IV  Anesthesia Plan: General   Post-op Pain Management:    Induction:   Airway Management Planned:   Additional Equipment:   Intra-op Plan:   Post-operative Plan:   Informed Consent: I have reviewed the patients History and  Physical, chart, labs and discussed the procedure including the risks, benefits and alternatives for the proposed anesthesia with the patient or authorized representative who has indicated his/her understanding and acceptance.   Dental Advisory Given  Plan Discussed with: Anesthesiologist, CRNA and Surgeon  Anesthesia Plan Comments:         Anesthesia Quick Evaluation

## 2016-12-03 NOTE — Transfer of Care (Signed)
Immediate Anesthesia Transfer of Care Note  Patient: Tasha Howe  Procedure(s) Performed: Procedure(s): KYPHOPLASTY L1,L3 (N/A)  Patient Location: PACU  Anesthesia Type:MAC  Level of Consciousness: drowsy and patient cooperative  Airway & Oxygen Therapy: Patient Spontanous Breathing and Patient connected to nasal cannula oxygen  Post-op Assessment: Report given to RN and Post -op Vital signs reviewed and stable  Post vital signs: Reviewed and stable  Last Vitals:  Vitals:   12/03/16 1220 12/03/16 1600  BP:  136/79  Pulse:  84  Resp: (!) 24 (!) 22  Temp:  37.7 C    Last Pain:  Vitals:   12/03/16 1600  TempSrc:   PainSc: (P) Asleep         Complications: No apparent anesthesia complications

## 2016-12-03 NOTE — Op Note (Signed)
12/03/2016  4:02 PM  PATIENT:  Tasha Howe  76 y.o. 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 C-arm was brought in and good visualization of L1 and L3 was obtained. After patient identification and timeout procedure completed 10 cc of 1% Xylocaine was overdrilled subcutaneously. Is at both the L5 1 and L3 levels 5 cc in each. The back was then prepped and draped in usual sterile fashion and repeat timeout procedure carried out. Spinal needle was used to get local anesthetic down to the pedicle at L1 and L3. Small incisions were made and trocar advanced with the express system into the vertebral body and drilling carried out followed by a balloon insertion and inflation. After the cemented was of the appropriate consistency the vertebral bodies were filled with good interdigitation and good fill each body with out any significant extravasation. After the cement set trochars removed and the wound closed with Dermabond followed by a Band-Aid  PLAN OF CARE: Discharge to home after PACU  PATIENT DISPOSITION:  PACU - hemodynamically stable.

## 2016-12-03 NOTE — H&P (Signed)
Reviewed paper H+P, will be scanned into chart. Patient examined No changes noted.  

## 2016-12-03 NOTE — Discharge Instructions (Signed)
Staging easy this weekend resume normal activities Monday. Remove Band-Aid on Saturday okay to shower after that. Pain medicine as directed try to take as little as possible, resume Plavix tomorrow   AMBULATORY SURGERY  DISCHARGE INSTRUCTIONS   1) The drugs that you were given will stay in your system until tomorrow so for the next 24 hours you should not:  A) Drive an automobile B) Make any legal decisions C) Drink any alcoholic beverage   2) You may resume regular meals tomorrow.  Today it is better to start with liquids and gradually work up to solid foods.  You may eat anything you prefer, but it is better to start with liquids, then soup and crackers, and gradually work up to solid foods.   3) Please notify your doctor immediately if you have any unusual bleeding, trouble breathing, redness and pain at the surgery site, drainage, fever, or pain not relieved by medication.   4) Additional Instructions:

## 2016-12-03 NOTE — OR Nursing (Signed)
1642 pt c/o nausea, dry heaves no vomiting and having hot flashes.

## 2016-12-03 NOTE — Anesthesia Post-op Follow-up Note (Cosign Needed)
Anesthesia QCDR form completed.        

## 2016-12-03 NOTE — Anesthesia Postprocedure Evaluation (Signed)
Anesthesia Post Note  Patient: ARITZA BRUNET  Procedure(s) Performed: Procedure(s) (LRB): KYPHOPLASTY L1,L3 (N/A)  Patient location during evaluation: PACU Anesthesia Type: MAC Level of consciousness: awake and alert Pain management: pain level controlled Vital Signs Assessment: post-procedure vital signs reviewed and stable Respiratory status: spontaneous breathing and respiratory function stable Cardiovascular status: stable Anesthetic complications: no     Last Vitals:  Vitals:   12/03/16 1220 12/03/16 1600  BP:  136/79  Pulse:  84  Resp: (!) 24 (!) 22  Temp:  37.7 C    Last Pain:  Vitals:   12/03/16 1600  TempSrc:   PainSc: Asleep    LLE Motor Response: Purposeful movement (12/03/16 1600) LLE Sensation: Full sensation (12/03/16 1600) RLE Motor Response: Purposeful movement (12/03/16 1600) RLE Sensation: Full sensation (12/03/16 1600)      Maribel Luis K

## 2016-12-04 ENCOUNTER — Encounter: Payer: Self-pay | Admitting: Orthopedic Surgery

## 2016-12-04 DIAGNOSIS — E785 Hyperlipidemia, unspecified: Secondary | ICD-10-CM | POA: Diagnosis not present

## 2016-12-04 DIAGNOSIS — I10 Essential (primary) hypertension: Secondary | ICD-10-CM | POA: Diagnosis not present

## 2016-12-04 DIAGNOSIS — J441 Chronic obstructive pulmonary disease with (acute) exacerbation: Secondary | ICD-10-CM | POA: Diagnosis not present

## 2016-12-04 DIAGNOSIS — Z9981 Dependence on supplemental oxygen: Secondary | ICD-10-CM | POA: Diagnosis not present

## 2016-12-04 DIAGNOSIS — K219 Gastro-esophageal reflux disease without esophagitis: Secondary | ICD-10-CM | POA: Diagnosis not present

## 2016-12-05 ENCOUNTER — Encounter: Payer: Self-pay | Admitting: Orthopedic Surgery

## 2016-12-05 NOTE — Addendum Note (Signed)
Addendum  created 12/05/16 0941 by Joseph K Piscitello, MD   Anesthesia Event edited    

## 2016-12-08 DIAGNOSIS — K219 Gastro-esophageal reflux disease without esophagitis: Secondary | ICD-10-CM | POA: Diagnosis not present

## 2016-12-08 DIAGNOSIS — I10 Essential (primary) hypertension: Secondary | ICD-10-CM | POA: Diagnosis not present

## 2016-12-08 DIAGNOSIS — Z9981 Dependence on supplemental oxygen: Secondary | ICD-10-CM | POA: Diagnosis not present

## 2016-12-08 DIAGNOSIS — J441 Chronic obstructive pulmonary disease with (acute) exacerbation: Secondary | ICD-10-CM | POA: Diagnosis not present

## 2016-12-08 DIAGNOSIS — E785 Hyperlipidemia, unspecified: Secondary | ICD-10-CM | POA: Diagnosis not present

## 2016-12-09 DIAGNOSIS — J439 Emphysema, unspecified: Secondary | ICD-10-CM | POA: Diagnosis not present

## 2016-12-15 ENCOUNTER — Other Ambulatory Visit: Payer: Self-pay

## 2016-12-15 DIAGNOSIS — I10 Essential (primary) hypertension: Secondary | ICD-10-CM | POA: Diagnosis not present

## 2016-12-15 DIAGNOSIS — E785 Hyperlipidemia, unspecified: Secondary | ICD-10-CM | POA: Diagnosis not present

## 2016-12-15 DIAGNOSIS — Z9981 Dependence on supplemental oxygen: Secondary | ICD-10-CM | POA: Diagnosis not present

## 2016-12-15 DIAGNOSIS — K219 Gastro-esophageal reflux disease without esophagitis: Secondary | ICD-10-CM | POA: Diagnosis not present

## 2016-12-15 DIAGNOSIS — J441 Chronic obstructive pulmonary disease with (acute) exacerbation: Secondary | ICD-10-CM | POA: Diagnosis not present

## 2016-12-19 DIAGNOSIS — J449 Chronic obstructive pulmonary disease, unspecified: Secondary | ICD-10-CM | POA: Diagnosis not present

## 2016-12-21 DIAGNOSIS — Z9889 Other specified postprocedural states: Secondary | ICD-10-CM | POA: Diagnosis not present

## 2016-12-22 DIAGNOSIS — I10 Essential (primary) hypertension: Secondary | ICD-10-CM | POA: Diagnosis not present

## 2016-12-22 DIAGNOSIS — K219 Gastro-esophageal reflux disease without esophagitis: Secondary | ICD-10-CM | POA: Diagnosis not present

## 2016-12-22 DIAGNOSIS — E785 Hyperlipidemia, unspecified: Secondary | ICD-10-CM | POA: Diagnosis not present

## 2016-12-22 DIAGNOSIS — Z9981 Dependence on supplemental oxygen: Secondary | ICD-10-CM | POA: Diagnosis not present

## 2016-12-22 DIAGNOSIS — J441 Chronic obstructive pulmonary disease with (acute) exacerbation: Secondary | ICD-10-CM | POA: Diagnosis not present

## 2016-12-31 DIAGNOSIS — J441 Chronic obstructive pulmonary disease with (acute) exacerbation: Secondary | ICD-10-CM | POA: Diagnosis not present

## 2016-12-31 DIAGNOSIS — E785 Hyperlipidemia, unspecified: Secondary | ICD-10-CM | POA: Diagnosis not present

## 2016-12-31 DIAGNOSIS — Z9981 Dependence on supplemental oxygen: Secondary | ICD-10-CM | POA: Diagnosis not present

## 2016-12-31 DIAGNOSIS — K219 Gastro-esophageal reflux disease without esophagitis: Secondary | ICD-10-CM | POA: Diagnosis not present

## 2016-12-31 DIAGNOSIS — I10 Essential (primary) hypertension: Secondary | ICD-10-CM | POA: Diagnosis not present

## 2017-01-05 DIAGNOSIS — K219 Gastro-esophageal reflux disease without esophagitis: Secondary | ICD-10-CM | POA: Diagnosis not present

## 2017-01-05 DIAGNOSIS — I10 Essential (primary) hypertension: Secondary | ICD-10-CM | POA: Diagnosis not present

## 2017-01-05 DIAGNOSIS — J441 Chronic obstructive pulmonary disease with (acute) exacerbation: Secondary | ICD-10-CM | POA: Diagnosis not present

## 2017-01-05 DIAGNOSIS — Z9981 Dependence on supplemental oxygen: Secondary | ICD-10-CM | POA: Diagnosis not present

## 2017-01-05 DIAGNOSIS — E785 Hyperlipidemia, unspecified: Secondary | ICD-10-CM | POA: Diagnosis not present

## 2017-01-08 DIAGNOSIS — J439 Emphysema, unspecified: Secondary | ICD-10-CM | POA: Diagnosis not present

## 2017-01-14 DIAGNOSIS — I10 Essential (primary) hypertension: Secondary | ICD-10-CM | POA: Diagnosis not present

## 2017-01-14 DIAGNOSIS — K219 Gastro-esophageal reflux disease without esophagitis: Secondary | ICD-10-CM | POA: Diagnosis not present

## 2017-01-14 DIAGNOSIS — E785 Hyperlipidemia, unspecified: Secondary | ICD-10-CM | POA: Diagnosis not present

## 2017-01-14 DIAGNOSIS — Z9981 Dependence on supplemental oxygen: Secondary | ICD-10-CM | POA: Diagnosis not present

## 2017-01-14 DIAGNOSIS — J441 Chronic obstructive pulmonary disease with (acute) exacerbation: Secondary | ICD-10-CM | POA: Diagnosis not present

## 2017-01-19 DIAGNOSIS — I10 Essential (primary) hypertension: Secondary | ICD-10-CM | POA: Diagnosis not present

## 2017-01-19 DIAGNOSIS — Z9981 Dependence on supplemental oxygen: Secondary | ICD-10-CM | POA: Diagnosis not present

## 2017-01-19 DIAGNOSIS — E785 Hyperlipidemia, unspecified: Secondary | ICD-10-CM | POA: Diagnosis not present

## 2017-01-19 DIAGNOSIS — K219 Gastro-esophageal reflux disease without esophagitis: Secondary | ICD-10-CM | POA: Diagnosis not present

## 2017-01-19 DIAGNOSIS — J441 Chronic obstructive pulmonary disease with (acute) exacerbation: Secondary | ICD-10-CM | POA: Diagnosis not present

## 2017-01-19 DIAGNOSIS — J449 Chronic obstructive pulmonary disease, unspecified: Secondary | ICD-10-CM | POA: Diagnosis not present

## 2017-01-26 DIAGNOSIS — K219 Gastro-esophageal reflux disease without esophagitis: Secondary | ICD-10-CM | POA: Diagnosis not present

## 2017-01-26 DIAGNOSIS — Z9981 Dependence on supplemental oxygen: Secondary | ICD-10-CM | POA: Diagnosis not present

## 2017-01-26 DIAGNOSIS — E785 Hyperlipidemia, unspecified: Secondary | ICD-10-CM | POA: Diagnosis not present

## 2017-01-26 DIAGNOSIS — J441 Chronic obstructive pulmonary disease with (acute) exacerbation: Secondary | ICD-10-CM | POA: Diagnosis not present

## 2017-01-26 DIAGNOSIS — I10 Essential (primary) hypertension: Secondary | ICD-10-CM | POA: Diagnosis not present

## 2017-01-28 DIAGNOSIS — J439 Emphysema, unspecified: Secondary | ICD-10-CM | POA: Diagnosis not present

## 2017-02-03 DIAGNOSIS — I10 Essential (primary) hypertension: Secondary | ICD-10-CM | POA: Diagnosis not present

## 2017-02-03 DIAGNOSIS — E785 Hyperlipidemia, unspecified: Secondary | ICD-10-CM | POA: Diagnosis not present

## 2017-02-03 DIAGNOSIS — J441 Chronic obstructive pulmonary disease with (acute) exacerbation: Secondary | ICD-10-CM | POA: Diagnosis not present

## 2017-02-03 DIAGNOSIS — K219 Gastro-esophageal reflux disease without esophagitis: Secondary | ICD-10-CM | POA: Diagnosis not present

## 2017-02-03 DIAGNOSIS — Z9981 Dependence on supplemental oxygen: Secondary | ICD-10-CM | POA: Diagnosis not present

## 2017-02-05 ENCOUNTER — Other Ambulatory Visit: Payer: Self-pay

## 2017-02-05 DIAGNOSIS — E039 Hypothyroidism, unspecified: Secondary | ICD-10-CM

## 2017-02-05 MED ORDER — LEVOTHYROXINE SODIUM 88 MCG PO TABS: 88.0000 ug | ORAL_TABLET | Freq: Every day | ORAL | 1 refills | Status: DC

## 2017-02-18 DIAGNOSIS — J439 Emphysema, unspecified: Secondary | ICD-10-CM | POA: Diagnosis not present

## 2017-02-18 DIAGNOSIS — R05 Cough: Secondary | ICD-10-CM | POA: Diagnosis not present

## 2017-02-18 DIAGNOSIS — J449 Chronic obstructive pulmonary disease, unspecified: Secondary | ICD-10-CM | POA: Diagnosis not present

## 2017-03-02 DIAGNOSIS — J439 Emphysema, unspecified: Secondary | ICD-10-CM | POA: Diagnosis not present

## 2017-03-15 ENCOUNTER — Ambulatory Visit: Payer: Medicare Other

## 2017-03-15 ENCOUNTER — Telehealth: Payer: Self-pay | Admitting: Family Medicine

## 2017-03-21 DIAGNOSIS — J449 Chronic obstructive pulmonary disease, unspecified: Secondary | ICD-10-CM | POA: Diagnosis not present

## 2017-03-25 ENCOUNTER — Other Ambulatory Visit: Payer: Self-pay | Admitting: Family Medicine

## 2017-03-25 DIAGNOSIS — I359 Nonrheumatic aortic valve disorder, unspecified: Secondary | ICD-10-CM | POA: Diagnosis not present

## 2017-03-25 DIAGNOSIS — I10 Essential (primary) hypertension: Secondary | ICD-10-CM | POA: Diagnosis not present

## 2017-03-25 DIAGNOSIS — K219 Gastro-esophageal reflux disease without esophagitis: Secondary | ICD-10-CM

## 2017-03-25 DIAGNOSIS — J439 Emphysema, unspecified: Secondary | ICD-10-CM | POA: Diagnosis not present

## 2017-03-31 DIAGNOSIS — Z13820 Encounter for screening for osteoporosis: Secondary | ICD-10-CM | POA: Diagnosis not present

## 2017-03-31 DIAGNOSIS — M899 Disorder of bone, unspecified: Secondary | ICD-10-CM | POA: Diagnosis not present

## 2017-04-06 ENCOUNTER — Telehealth: Payer: Self-pay

## 2017-04-06 NOTE — Telephone Encounter (Signed)
Patient will come by 04/07/17 for physician forms to be filled out for [employer]

## 2017-04-15 DIAGNOSIS — J439 Emphysema, unspecified: Secondary | ICD-10-CM | POA: Diagnosis not present

## 2017-04-21 DIAGNOSIS — J449 Chronic obstructive pulmonary disease, unspecified: Secondary | ICD-10-CM | POA: Diagnosis not present

## 2017-05-11 DIAGNOSIS — J439 Emphysema, unspecified: Secondary | ICD-10-CM | POA: Diagnosis not present

## 2017-05-12 ENCOUNTER — Other Ambulatory Visit: Payer: Self-pay | Admitting: Family Medicine

## 2017-05-12 DIAGNOSIS — F331 Major depressive disorder, recurrent, moderate: Secondary | ICD-10-CM

## 2017-05-12 DIAGNOSIS — I10 Essential (primary) hypertension: Secondary | ICD-10-CM

## 2017-05-13 ENCOUNTER — Other Ambulatory Visit: Payer: Self-pay | Admitting: Family Medicine

## 2017-05-13 DIAGNOSIS — I10 Essential (primary) hypertension: Secondary | ICD-10-CM

## 2017-05-18 ENCOUNTER — Ambulatory Visit (INDEPENDENT_AMBULATORY_CARE_PROVIDER_SITE_OTHER): Payer: Medicare Other

## 2017-05-18 DIAGNOSIS — Z23 Encounter for immunization: Secondary | ICD-10-CM

## 2017-05-21 DIAGNOSIS — J449 Chronic obstructive pulmonary disease, unspecified: Secondary | ICD-10-CM | POA: Diagnosis not present

## 2017-06-09 DIAGNOSIS — J439 Emphysema, unspecified: Secondary | ICD-10-CM | POA: Diagnosis not present

## 2017-06-11 ENCOUNTER — Other Ambulatory Visit: Payer: Self-pay | Admitting: Family Medicine

## 2017-06-11 DIAGNOSIS — I10 Essential (primary) hypertension: Secondary | ICD-10-CM

## 2017-06-21 DIAGNOSIS — J449 Chronic obstructive pulmonary disease, unspecified: Secondary | ICD-10-CM | POA: Diagnosis not present

## 2017-06-22 ENCOUNTER — Other Ambulatory Visit: Payer: Self-pay | Admitting: Family Medicine

## 2017-06-22 DIAGNOSIS — F331 Major depressive disorder, recurrent, moderate: Secondary | ICD-10-CM

## 2017-07-08 DIAGNOSIS — J439 Emphysema, unspecified: Secondary | ICD-10-CM | POA: Diagnosis not present

## 2017-07-19 ENCOUNTER — Other Ambulatory Visit: Payer: Self-pay | Admitting: Family Medicine

## 2017-07-19 DIAGNOSIS — F331 Major depressive disorder, recurrent, moderate: Secondary | ICD-10-CM

## 2017-07-19 DIAGNOSIS — K219 Gastro-esophageal reflux disease without esophagitis: Secondary | ICD-10-CM

## 2017-07-20 DIAGNOSIS — J438 Other emphysema: Secondary | ICD-10-CM | POA: Diagnosis not present

## 2017-07-20 DIAGNOSIS — R0602 Shortness of breath: Secondary | ICD-10-CM | POA: Diagnosis not present

## 2017-07-20 DIAGNOSIS — R0902 Hypoxemia: Secondary | ICD-10-CM | POA: Diagnosis not present

## 2017-07-21 DIAGNOSIS — J449 Chronic obstructive pulmonary disease, unspecified: Secondary | ICD-10-CM | POA: Diagnosis not present

## 2017-07-29 ENCOUNTER — Ambulatory Visit (INDEPENDENT_AMBULATORY_CARE_PROVIDER_SITE_OTHER): Payer: Medicare Other | Admitting: Family Medicine

## 2017-07-29 ENCOUNTER — Encounter: Payer: Self-pay | Admitting: Family Medicine

## 2017-07-29 VITALS — BP 112/70 | HR 80 | Ht 60.0 in | Wt 94.0 lb

## 2017-07-29 DIAGNOSIS — Z23 Encounter for immunization: Secondary | ICD-10-CM | POA: Diagnosis not present

## 2017-07-29 DIAGNOSIS — F331 Major depressive disorder, recurrent, moderate: Secondary | ICD-10-CM | POA: Diagnosis not present

## 2017-07-29 DIAGNOSIS — E785 Hyperlipidemia, unspecified: Secondary | ICD-10-CM | POA: Diagnosis not present

## 2017-07-29 DIAGNOSIS — E7849 Other hyperlipidemia: Secondary | ICD-10-CM

## 2017-07-29 DIAGNOSIS — K219 Gastro-esophageal reflux disease without esophagitis: Secondary | ICD-10-CM

## 2017-07-29 DIAGNOSIS — E039 Hypothyroidism, unspecified: Secondary | ICD-10-CM | POA: Diagnosis not present

## 2017-07-29 DIAGNOSIS — I10 Essential (primary) hypertension: Secondary | ICD-10-CM | POA: Diagnosis not present

## 2017-07-29 MED ORDER — LISINOPRIL 5 MG PO TABS: ORAL_TABLET | ORAL | 1 refills | Status: DC

## 2017-07-29 MED ORDER — OMEPRAZOLE 40 MG PO CPDR: 40.0000 mg | DELAYED_RELEASE_CAPSULE | Freq: Every day | ORAL | 1 refills | Status: DC

## 2017-07-29 MED ORDER — FISH OIL 1000 MG PO CPDR: 2.0000 | DELAYED_RELEASE_CAPSULE | Freq: Every day | ORAL | Status: AC

## 2017-07-29 MED ORDER — CITALOPRAM HYDROBROMIDE 20 MG PO TABS: 20.0000 mg | ORAL_TABLET | Freq: Every day | ORAL | 1 refills | Status: DC

## 2017-07-29 MED ORDER — LEVOTHYROXINE SODIUM 88 MCG PO TABS: 88.0000 ug | ORAL_TABLET | Freq: Every day | ORAL | 1 refills | Status: DC

## 2017-07-29 NOTE — Progress Notes (Signed)
Name: Tasha Howe   MRN: [id number]    DOB: July 12, 1941   Date:07/29/2017       Progress Note  Subjective  Chief Complaint  Chief Complaint  Patient presents with  . Depression  . Gastroesophageal Reflux  . Hypothyroidism  . Hypertension    Depression         This is a chronic problem.  The current episode started more than 1 year ago.   The onset quality is gradual.   The problem has been gradually improving since onset.  Associated symptoms include no decreased concentration, no fatigue, no helplessness, no hopelessness, does not have insomnia, not irritable, no restlessness, no decreased interest, no appetite change, no body aches, no myalgias, no headaches, no indigestion, not sad and no suicidal ideas.  Past treatments include SSRIs - Selective serotonin reuptake inhibitors.  Compliance with treatment is good.  Previous treatment provided mild relief.  Past medical history includes thyroid problem.     Pertinent negatives include no anxiety. Gastroesophageal Reflux  She reports no abdominal pain, no belching, no chest pain, no choking, no coughing, no dysphagia, no early satiety, no globus sensation, no heartburn, no hoarse voice, no nausea, no sore throat, no stridor, no tooth decay, no water brash or no wheezing. This is a new problem. The problem occurs constantly. The problem has been unchanged. Pertinent negatives include no fatigue, melena or weight loss. Risk factors include obesity. She has tried a PPI for the symptoms. The treatment provided moderate relief. Past procedures do not include an abdominal ultrasound, an EGD, esophageal manometry, esophageal pH monitoring, H. pylori antibody titer or a UGI.  Hypertension  This is a chronic problem. The current episode started more than 1 year ago. The problem has been gradually worsening since onset. Pertinent negatives include no anxiety, blurred vision, chest pain, headaches, malaise/fatigue, neck pain, orthopnea, palpitations,  peripheral edema, PND, shortness of breath or sweats. There are no associated agents to hypertension. Risk factors for coronary artery disease include diabetes mellitus and dyslipidemia. Past treatments include ACE inhibitors. The current treatment provides moderate improvement. There are no compliance problems.  There is no history of angina, kidney disease, CAD/MI, CVA, heart failure, left ventricular hypertrophy, PVD or retinopathy. Identifiable causes of hypertension include a thyroid problem. There is no history of chronic renal disease, a hypertension causing med or renovascular disease.  Thyroid Problem  Presents for follow-up visit. Patient reports no anxiety, cold intolerance, constipation, depressed mood, diarrhea, fatigue, hair loss, heat intolerance, hoarse voice, palpitations, visual change, weight gain or weight loss. The symptoms have been stable. There is no history of heart failure.    No problem-specific Assessment & Plan notes found for this encounter.   Past Medical History:  Diagnosis Date  . Aortic anomaly    VALVE DISEASE  . COPD (chronic obstructive pulmonary disease) (HCC)    SEVERE   . Depression   . GERD (gastroesophageal reflux disease)   . Hyperlipidemia   . Hypertension   . Hypothyroidism   . Thyroid disease   . Tremors of nervous system     Past Surgical History:  Procedure Laterality Date  . BREAST SURGERY     CYST  . HERNIA REPAIR  08/10/1989  . KYPHOPLASTY N/A 12/03/2016   Procedure: KYPHOPLASTY L1,L3;  Surgeon: Kennedy Bucker, MD;  Location: [facility] ORS;  Service: Orthopedics;  Laterality: N/A;  . VAGINAL HYSTERECTOMY  08/10/1958  . VARICOSE VEIN SURGERY    . vascular stent  08/10/1978  History reviewed. No pertinent family history.  Social History   Socioeconomic History  . Marital status: Divorced    Spouse name: Not on file  . Number of children: Not on file  . Years of education: Not on file  . Highest education level: Not on file  Social  Needs  . Financial resource strain: Not on file  . Food insecurity - worry: Not on file  . Food insecurity - inability: Not on file  . Transportation needs - medical: Not on file  . Transportation needs - non-medical: Not on file  Occupational History  . Not on file  Tobacco Use  . Smoking status: Former Smoker    Types: Cigarettes    Last attempt to quit: 08/11/2003    Years since quitting: 13.9  . Smokeless tobacco: Never Used  Substance and Sexual Activity  . Alcohol use: No    Alcohol/week: 0.0 oz  . Drug use: No  . Sexual activity: Not Currently  Other Topics Concern  . Not on file  Social History Narrative  . Not on file    Allergies  Allergen Reactions  . Alendronate     Other reaction(s): Other (See Comments) GI Intolerance  . Sulfa Antibiotics Hives    Outpatient Medications Prior to Visit  Medication Sig Dispense Refill  . acetaminophen (TYLENOL) 500 MG tablet Take 1,000 mg by mouth every 6 (six) hours as needed for mild pain.    Marland Kitchen. albuterol (PROAIR HFA) 108 (90 Base) MCG/ACT inhaler Inhale 2 puffs into the lungs every 6 (six) hours as needed for wheezing or shortness of breath.    . Ascorbic Acid (VITAMIN C) 500 MG CAPS Take 500 mg by mouth daily at 12 noon.     Marland Kitchen. aspirin 81 MG tablet Take 1 tablet by mouth daily at 12 noon.     . Calcium Carbonate-Vit D-Min (CALCIUM 600 + MINERALS PO) Take 1 tablet by mouth daily at 12 noon.     . Cyanocobalamin (B-12) 1000 MCG CAPS Take 1,000 mcg by mouth daily.    Marland Kitchen. docusate sodium (COLACE) 100 MG capsule Take 100 mg by mouth daily.    . Fluticasone Furoate-Vilanterol 100-25 MCG/INH AEPB Inhale 1 puff into the lungs daily. Dr Meredeth IdeFleming    . ipratropium-albuterol (DUONEB) 0.5-2.5 (3) MG/3ML SOLN Take 3 mLs by nebulization every 6 (six) hours. 360 mL 0  . Multiple Vitamin (MULTIVITAMIN WITH MINERALS) TABS tablet Take 1 tablet by mouth daily.    Marland Kitchen. SPIRIVA RESPIMAT 2.5 MCG/ACT AERS Inhale 2 puffs into the lungs daily.     . vitamin  E 400 UNIT capsule Take 400 Units by mouth daily.    . citalopram (CELEXA) 20 MG tablet TAKE 1 TABLET BY MOUTH ONCE DAILY 30 tablet 0  . levothyroxine (SYNTHROID, LEVOTHROID) 88 MCG tablet Take 1 tablet (88 mcg total) by mouth daily. 90 tablet 1  . lisinopril (PRINIVIL,ZESTRIL) 5 MG tablet TAKE ONE TABLET BY MOUTH ONCE DAILY DR FATH 30 tablet 0  . Omega-3 Fatty Acids (FISH OIL) 1000 MG CPDR Take 3 capsules by mouth daily. (Patient taking differently: Take 2 capsules by mouth daily. ) 90 capsule 3  . omeprazole (PRILOSEC) 40 MG capsule TAKE 1 CAPSULE BY MOUTH ONCE DAILY 30 capsule 0  . HYDROcodone-acetaminophen (NORCO) 5-325 MG tablet Take 1 tablet by mouth every 6 (six) hours as needed for moderate pain. 20 tablet 0  . HYDROcodone-acetaminophen (NORCO/VICODIN) 5-325 MG tablet Take 1 tablet by mouth 2 (two) times daily  as needed for pain.    Marland Kitchen. lisinopril (PRINIVIL,ZESTRIL) 5 MG tablet TAKE ONE TABLET BY MOUTH ONCE DAILY DR FATH 90 tablet 0   No facility-administered medications prior to visit.     Review of Systems  Constitutional: Negative for appetite change, chills, fatigue, fever, malaise/fatigue, weight gain and weight loss.  HENT: Negative for ear discharge, ear pain, hoarse voice and sore throat.   Eyes: Negative for blurred vision.  Respiratory: Negative for cough, sputum production, choking, shortness of breath and wheezing.   Cardiovascular: Negative for chest pain, palpitations, orthopnea, leg swelling and PND.  Gastrointestinal: Negative for abdominal pain, blood in stool, constipation, diarrhea, dysphagia, heartburn, melena and nausea.  Genitourinary: Negative for dysuria, frequency, hematuria and urgency.  Musculoskeletal: Negative for back pain, joint pain, myalgias and neck pain.  Skin: Negative for rash.  Neurological: Negative for dizziness, tingling, sensory change, focal weakness and headaches.  Endo/Heme/Allergies: Negative for environmental allergies, cold intolerance,  heat intolerance and polydipsia. Does not bruise/bleed easily.  Psychiatric/Behavioral: Positive for depression. Negative for decreased concentration and suicidal ideas. The patient is not nervous/anxious and does not have insomnia.      Objective  Vitals:   07/29/17 1543  BP: 112/70  Pulse: 80  Weight: 94 lb (42.6 kg)  Height: 5' (1.524 m)    Physical Exam  Constitutional: She is well-developed, well-nourished, and in no distress. She is not irritable. No distress.  HENT:  Head: Normocephalic and atraumatic.  Right Ear: External ear normal.  Left Ear: External ear normal.  Nose: Nose normal.  Mouth/Throat: Oropharynx is clear and moist.  Eyes: Conjunctivae and EOM are normal. Pupils are equal, round, and reactive to light. Right eye exhibits no discharge. Left eye exhibits no discharge.  Neck: Normal range of motion. Neck supple. No JVD present. No thyromegaly present.  Cardiovascular: Normal rate, regular rhythm, normal heart sounds and intact distal pulses. Exam reveals no gallop and no friction rub.  No murmur heard. Pulmonary/Chest: Effort normal and breath sounds normal. She has no wheezes. She has no rales. She exhibits no tenderness.  Abdominal: Soft. Bowel sounds are normal. She exhibits no mass. There is no tenderness. There is no guarding.  Musculoskeletal: Normal range of motion. She exhibits no edema.  Lymphadenopathy:    She has no cervical adenopathy.  Neurological: She is alert. She has normal reflexes.  Skin: Skin is warm and dry. She is not diaphoretic.  Psychiatric: Mood and affect normal.  Nursing note and vitals reviewed.     Assessment & Plan  Problem List Items Addressed This Visit      Cardiovascular and Mediastinum   Hypertension - Primary   Relevant Medications   lisinopril (PRINIVIL,ZESTRIL) 5 MG tablet   Other Relevant Orders   Renal function panel     Digestive   Gastroesophageal reflux disease   Relevant Medications   omeprazole  (PRILOSEC) 40 MG capsule     Endocrine   Hypothyroidism (acquired)   Relevant Medications   levothyroxine (SYNTHROID, LEVOTHROID) 88 MCG tablet   Other Relevant Orders   TSH     Other   Depression   Relevant Medications   citalopram (CELEXA) 20 MG tablet   Hyperlipemia   Relevant Medications   lisinopril (PRINIVIL,ZESTRIL) 5 MG tablet   Omega-3 Fatty Acids (FISH OIL) 1000 MG CPDR   Other Relevant Orders   Lipid panel    Other Visit Diagnoses    Need for 23-polyvalent pneumococcal polysaccharide vaccine  Relevant Orders   Pneumococcal polysaccharide vaccine 23-valent greater than or equal to 2yo subcutaneous/IM (Completed)      Meds ordered this encounter  Medications  . lisinopril (PRINIVIL,ZESTRIL) 5 MG tablet    Sig: TAKE ONE TABLET BY MOUTH ONCE DAILY DR FATH    Dispense:  90 tablet    Refill:  1  . omeprazole (PRILOSEC) 40 MG capsule    Sig: Take 1 capsule (40 mg total) by mouth daily.    Dispense:  90 capsule    Refill:  1    Pt has to sched appt  . Omega-3 Fatty Acids (FISH OIL) 1000 MG CPDR    Sig: Take 2 capsules by mouth daily.  Marland Kitchen levothyroxine (SYNTHROID, LEVOTHROID) 88 MCG tablet    Sig: Take 1 tablet (88 mcg total) by mouth daily.    Dispense:  90 tablet    Refill:  1  . citalopram (CELEXA) 20 MG tablet    Sig: Take 1 tablet (20 mg total) by mouth daily.    Dispense:  90 tablet    Refill:  1    Pt has to sched appt for meds      Dr. Elizabeth Sauer [facility]  07/29/17

## 2017-07-30 LAB — RENAL FUNCTION PANEL
ALBUMIN: 4.2 g/dL (ref 3.5–4.8)
BUN/Creatinine Ratio: 25 (ref 12–28)
BUN: 15 mg/dL (ref 8–27)
CO2: 29 mmol/L (ref 20–29)
Calcium: 9.4 mg/dL (ref 8.7–10.3)
Chloride: 102 mmol/L (ref 96–106)
Creatinine, Ser: 0.6 mg/dL (ref 0.57–1.00)
GFR, EST AFRICAN AMERICAN: 102 mL/min/{1.73_m2} (ref >59)
GFR, EST NON AFRICAN AMERICAN: 89 mL/min/{1.73_m2} (ref >59)
GLUCOSE: 85 mg/dL (ref 65–99)
POTASSIUM: 4.8 mmol/L (ref 3.5–5.2)
Phosphorus: 3.4 mg/dL (ref 2.5–4.5)
Sodium: 145 mmol/L — ABNORMAL HIGH (ref 134–144)

## 2017-07-30 LAB — LIPID PANEL
CHOL/HDL RATIO: 2.7 ratio (ref 0.0–4.4)
Cholesterol, Total: 162 mg/dL (ref 100–199)
HDL: 61 mg/dL (ref >39)
LDL Calculated: 90 mg/dL (ref 0–99)
Triglycerides: 56 mg/dL (ref 0–149)
VLDL CHOLESTEROL CAL: 11 mg/dL (ref 5–40)

## 2017-07-30 LAB — TSH: TSH: 0.135 u[IU]/mL — ABNORMAL LOW (ref 0.450–4.500)

## 2017-08-06 ENCOUNTER — Other Ambulatory Visit: Payer: Self-pay

## 2017-08-16 DIAGNOSIS — J439 Emphysema, unspecified: Secondary | ICD-10-CM | POA: Diagnosis not present

## 2017-08-21 DIAGNOSIS — J449 Chronic obstructive pulmonary disease, unspecified: Secondary | ICD-10-CM | POA: Diagnosis not present

## 2017-09-21 DIAGNOSIS — J449 Chronic obstructive pulmonary disease, unspecified: Secondary | ICD-10-CM | POA: Diagnosis not present

## 2017-09-23 DIAGNOSIS — J439 Emphysema, unspecified: Secondary | ICD-10-CM | POA: Diagnosis not present

## 2017-09-24 ENCOUNTER — Other Ambulatory Visit: Payer: Self-pay

## 2017-09-24 ENCOUNTER — Other Ambulatory Visit: Payer: Self-pay | Admitting: Family Medicine

## 2017-09-24 ENCOUNTER — Other Ambulatory Visit: Payer: Medicare Other

## 2017-09-24 DIAGNOSIS — J438 Other emphysema: Secondary | ICD-10-CM | POA: Diagnosis not present

## 2017-09-24 DIAGNOSIS — E039 Hypothyroidism, unspecified: Secondary | ICD-10-CM | POA: Diagnosis not present

## 2017-09-24 DIAGNOSIS — I359 Nonrheumatic aortic valve disorder, unspecified: Secondary | ICD-10-CM | POA: Diagnosis not present

## 2017-09-24 DIAGNOSIS — I10 Essential (primary) hypertension: Secondary | ICD-10-CM | POA: Diagnosis not present

## 2017-09-25 LAB — TSH: TSH: 0.171 u[IU]/mL — AB (ref 0.450–4.500)

## 2017-09-27 ENCOUNTER — Other Ambulatory Visit: Payer: Self-pay

## 2017-09-27 DIAGNOSIS — E039 Hypothyroidism, unspecified: Secondary | ICD-10-CM

## 2017-09-27 MED ORDER — LEVOTHYROXINE SODIUM 75 MCG PO TABS: 75.0000 ug | ORAL_TABLET | Freq: Every day | ORAL | 1 refills | Status: DC

## 2017-10-19 DIAGNOSIS — J449 Chronic obstructive pulmonary disease, unspecified: Secondary | ICD-10-CM | POA: Diagnosis not present

## 2017-10-28 ENCOUNTER — Other Ambulatory Visit: Payer: Medicare Other

## 2017-10-28 ENCOUNTER — Other Ambulatory Visit: Payer: Self-pay

## 2017-10-28 DIAGNOSIS — E039 Hypothyroidism, unspecified: Secondary | ICD-10-CM

## 2017-10-29 ENCOUNTER — Other Ambulatory Visit: Payer: Self-pay

## 2017-10-29 DIAGNOSIS — E039 Hypothyroidism, unspecified: Secondary | ICD-10-CM

## 2017-10-29 LAB — THYROID PANEL WITH TSH
FREE THYROXINE INDEX: 2.4 (ref 1.2–4.9)
T3 Uptake Ratio: 29 % (ref 24–39)
T4, Total: 8.2 ug/dL (ref 4.5–12.0)
TSH: 0.338 u[IU]/mL — AB (ref 0.450–4.500)

## 2017-10-29 MED ORDER — LEVOTHYROXINE SODIUM 75 MCG PO TABS: 75.0000 ug | ORAL_TABLET | Freq: Every day | ORAL | 1 refills | Status: DC

## 2017-11-04 DIAGNOSIS — J439 Emphysema, unspecified: Secondary | ICD-10-CM | POA: Diagnosis not present

## 2017-11-18 DIAGNOSIS — J432 Centrilobular emphysema: Secondary | ICD-10-CM | POA: Diagnosis not present

## 2017-11-18 DIAGNOSIS — Z9981 Dependence on supplemental oxygen: Secondary | ICD-10-CM | POA: Diagnosis not present

## 2017-11-18 DIAGNOSIS — R0609 Other forms of dyspnea: Secondary | ICD-10-CM | POA: Diagnosis not present

## 2017-11-19 DIAGNOSIS — J449 Chronic obstructive pulmonary disease, unspecified: Secondary | ICD-10-CM | POA: Diagnosis not present

## 2017-12-01 DIAGNOSIS — J439 Emphysema, unspecified: Secondary | ICD-10-CM | POA: Diagnosis not present

## 2017-12-06 IMAGING — CR DG C-ARM 61-120 MIN
1 series · 1 of 1 positions shown · non-contrast
Comparison: MRI lumbar spine October 24, 2016

CLINICAL DATA: Elective surgery.

EXAM:
LUMBAR SPINE - 2-3 VIEW; DG C-ARM 61-120 MIN
FLUOROSCOPY TIME:  2 minutes and 19 seconds, cumulative area dose
product 248.77 cNycmG

[cont.]
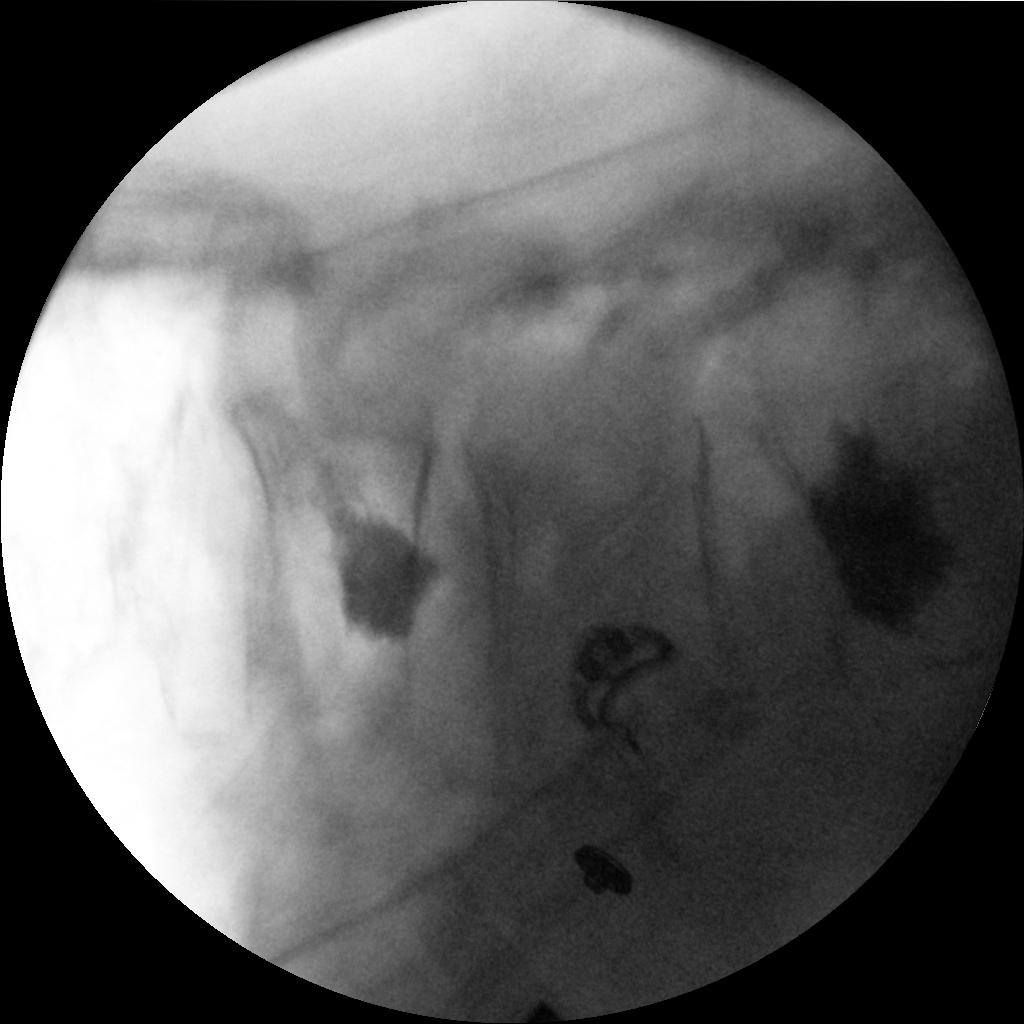

[1 of 1 positions shown; findings below may reference images not displayed]

FINDINGS: Two fluoroscopic spot views submitted, interpreting radiologist was
not present at time of procedure. Two non continuous level vertebral
body cement augmentation.
IMPRESSION: Two level vertebral body cement augmentation.

## 2017-12-19 DIAGNOSIS — J449 Chronic obstructive pulmonary disease, unspecified: Secondary | ICD-10-CM | POA: Diagnosis not present

## 2017-12-27 DIAGNOSIS — J439 Emphysema, unspecified: Secondary | ICD-10-CM | POA: Diagnosis not present

## 2018-01-19 DIAGNOSIS — J449 Chronic obstructive pulmonary disease, unspecified: Secondary | ICD-10-CM | POA: Diagnosis not present

## 2018-01-21 DIAGNOSIS — J439 Emphysema, unspecified: Secondary | ICD-10-CM | POA: Diagnosis not present

## 2018-02-15 ENCOUNTER — Other Ambulatory Visit: Payer: Self-pay | Admitting: Family Medicine

## 2018-02-15 DIAGNOSIS — J439 Emphysema, unspecified: Secondary | ICD-10-CM | POA: Diagnosis not present

## 2018-02-15 DIAGNOSIS — K219 Gastro-esophageal reflux disease without esophagitis: Secondary | ICD-10-CM

## 2018-02-15 DIAGNOSIS — F331 Major depressive disorder, recurrent, moderate: Secondary | ICD-10-CM

## 2018-02-18 DIAGNOSIS — J449 Chronic obstructive pulmonary disease, unspecified: Secondary | ICD-10-CM | POA: Diagnosis not present

## 2018-03-01 ENCOUNTER — Ambulatory Visit (INDEPENDENT_AMBULATORY_CARE_PROVIDER_SITE_OTHER): Payer: Medicare Other | Admitting: Family Medicine

## 2018-03-01 ENCOUNTER — Encounter: Payer: Self-pay | Admitting: Family Medicine

## 2018-03-01 VITALS — BP 112/76 | HR 63 | Resp 16 | Ht 63.0 in | Wt 88.0 lb

## 2018-03-01 DIAGNOSIS — I10 Essential (primary) hypertension: Secondary | ICD-10-CM | POA: Diagnosis not present

## 2018-03-01 DIAGNOSIS — R5383 Other fatigue: Secondary | ICD-10-CM

## 2018-03-01 DIAGNOSIS — J4489 Other specified chronic obstructive pulmonary disease: Secondary | ICD-10-CM | POA: Insufficient documentation

## 2018-03-01 DIAGNOSIS — J449 Chronic obstructive pulmonary disease, unspecified: Secondary | ICD-10-CM | POA: Diagnosis not present

## 2018-03-01 DIAGNOSIS — F331 Major depressive disorder, recurrent, moderate: Secondary | ICD-10-CM | POA: Insufficient documentation

## 2018-03-01 DIAGNOSIS — E039 Hypothyroidism, unspecified: Secondary | ICD-10-CM | POA: Diagnosis not present

## 2018-03-01 MED ORDER — LISINOPRIL 5 MG PO TABS: ORAL_TABLET | ORAL | 1 refills | Status: AC

## 2018-03-01 NOTE — Assessment & Plan Note (Signed)
Followed with Dr Mayo AoFlemming , Will continue Fluticasone/Villantreol inhaler/Spireva respimat/allbuterol inhaler.

## 2018-03-01 NOTE — Progress Notes (Signed)
Name: Tasha Howe   MRN: [id number]    DOB: 1941-06-04   Date:03/01/2018       Progress Note  Subjective  Chief Complaint  Chief Complaint  Patient presents with  . Hypertension    refill visit     Hypertension  This is a chronic problem. The current episode started more than 1 year ago. The problem has been gradually improving since onset. The problem is controlled. Pertinent negatives include no anxiety, blurred vision, chest pain, headaches, malaise/fatigue, neck pain, orthopnea, palpitations, peripheral edema, PND, shortness of breath or sweats. There are no associated agents to hypertension. There are no known risk factors for coronary artery disease. Past treatments include ACE inhibitors. The current treatment provides mild improvement. There is no history of angina, kidney disease, CAD/MI, CVA, heart failure, left ventricular hypertrophy, PVD or retinopathy. Identifiable causes of hypertension include a thyroid problem. There is no history of chronic renal disease, a hypertension causing med or renovascular disease.  Thyroid Problem  Presents for follow-up visit. Patient reports no anxiety, cold intolerance, constipation, depressed mood, diaphoresis, diarrhea, dry skin, fatigue, hair loss, heat intolerance, hoarse voice, leg swelling, nail problem, palpitations, tremors, visual change, weight gain or weight loss. The symptoms have been stable. There is no history of heart failure.    Hypertension Recent change dose of lisinopril to 5 mg. Controlled on present dose and will continue. Will check renal panel  Hypothyroidism Chronic Stable Will check TSH and adjust levothyroxin dose accordingly  Obstructive chronic bronchitis without exacerbation (HCC) Followed with Dr Mayo AoFlemming , Will continue Fluticasone/Villantreol inhaler/Spireva respimat/allbuterol inhaler.    Past Medical History:  Diagnosis Date  . Aortic anomaly    VALVE DISEASE  . COPD (chronic obstructive  pulmonary disease) (HCC)    SEVERE   . Depression   . GERD (gastroesophageal reflux disease)   . Hyperlipidemia   . Hypertension   . Hypothyroidism   . Thyroid disease   . Tremors of nervous system     Past Surgical History:  Procedure Laterality Date  . BREAST SURGERY     CYST  . HERNIA REPAIR  08/10/1989  . KYPHOPLASTY N/A 12/03/2016   Procedure: KYPHOPLASTY L1,L3;  Surgeon: Kennedy BuckerMichael Menz, MD;  Location: [facility] ORS;  Service: Orthopedics;  Laterality: N/A;  . VAGINAL HYSTERECTOMY  08/10/1958  . VARICOSE VEIN SURGERY    . vascular stent  08/10/1978    History reviewed. No pertinent family history.  Social History   Socioeconomic History  . Marital status: Divorced    Spouse name: Not on file  . Number of children: Not on file  . Years of education: Not on file  . Highest education level: Not on file  Occupational History  . Not on file  Social Needs  . Financial resource strain: Not on file  . Food insecurity:    Worry: Not on file    Inability: Not on file  . Transportation needs:    Medical: Not on file    Non-medical: Not on file  Tobacco Use  . Smoking status: Former Smoker    Types: Cigarettes    Last attempt to quit: 08/11/2003    Years since quitting: 14.5  . Smokeless tobacco: Never Used  Substance and Sexual Activity  . Alcohol use: No    Alcohol/week: 0.0 oz  . Drug use: No  . Sexual activity: Not Currently  Lifestyle  . Physical activity:    Days per week: Not on file    Minutes per  session: Not on file  . Stress: Not on file  Relationships  . Social connections:    Talks on phone: Not on file    Gets together: Not on file    Attends religious service: Not on file    Active member of club or organization: Not on file    Attends meetings of clubs or organizations: Not on file    Relationship status: Not on file  . Intimate partner violence:    Fear of current or ex partner: Not on file    Emotionally abused: Not on file    Physically abused: Not on  file    Forced sexual activity: Not on file  Other Topics Concern  . Not on file  Social History Narrative  . Not on file    Allergies  Allergen Reactions  . Alendronate Other (See Comments)    Other reaction(s): Other (See Comments) GI Intolerance GI Intolerance  . Sulfa Antibiotics Hives    Outpatient Medications Prior to Visit  Medication Sig Dispense Refill  . acetaminophen (TYLENOL) 500 MG tablet Take 1,000 mg by mouth every 6 (six) hours as needed for mild pain.    Marland Kitchen albuterol (PROAIR HFA) 108 (90 Base) MCG/ACT inhaler Inhale 2 puffs into the lungs every 6 (six) hours as needed for wheezing or shortness of breath.    . Ascorbic Acid (VITAMIN C) 500 MG CAPS Take 500 mg by mouth daily at 12 noon.     Marland Kitchen aspirin 81 MG tablet Take 1 tablet by mouth daily at 12 noon.     . Calcium Carbonate-Vit D-Min (CALCIUM 600 + MINERALS PO) Take 1 tablet by mouth daily at 12 noon.     . citalopram (CELEXA) 20 MG tablet TAKE 1 TABLET BY MOUTH ONCE DAILY PT HAS TO SCHEDULE APPOINTMENT FOR MEDS 30 tablet 0  . docusate sodium (COLACE) 100 MG capsule Take 100 mg by mouth daily.    . Fluticasone Furoate-Vilanterol 100-25 MCG/INH AEPB Inhale 1 puff into the lungs daily. Dr Meredeth Ide    . ipratropium-albuterol (DUONEB) 0.5-2.5 (3) MG/3ML SOLN Take 3 mLs by nebulization every 6 (six) hours. 360 mL 0  . levothyroxine (SYNTHROID, LEVOTHROID) 75 MCG tablet Take 1 tablet (75 mcg total) by mouth daily. 90 tablet 1  . Multiple Vitamin (MULTIVITAMIN WITH MINERALS) TABS tablet Take 1 tablet by mouth daily.    . Omega-3 Fatty Acids (FISH OIL) 1000 MG CPDR Take 2 capsules by mouth daily.    Marland Kitchen omeprazole (PRILOSEC) 40 MG capsule TAKE 1 CAPSULE BY MOUTH ONCE DAILY PATIENT HAS TO SCHEDULE APPOINTMENT 30 capsule 0  . SPIRIVA RESPIMAT 2.5 MCG/ACT AERS Inhale 2 puffs into the lungs daily.     . vitamin E 400 UNIT capsule Take 400 Units by mouth daily.    . Cyanocobalamin (B-12) 1000 MCG CAPS Take 1,000 mcg by mouth daily.     Marland Kitchen lisinopril (PRINIVIL,ZESTRIL) 5 MG tablet TAKE ONE TABLET BY MOUTH ONCE DAILY DR FATH 90 tablet 1   No facility-administered medications prior to visit.     Review of Systems  Constitutional: Negative for diaphoresis, fatigue, malaise/fatigue, weight gain and weight loss.  HENT: Negative for hoarse voice.   Eyes: Negative for blurred vision.  Respiratory: Negative for shortness of breath.   Cardiovascular: Negative for chest pain, palpitations, orthopnea and PND.  Gastrointestinal: Negative for constipation and diarrhea.  Musculoskeletal: Negative for neck pain.  Neurological: Negative for tremors and headaches.  Endo/Heme/Allergies: Negative for cold intolerance and heat  intolerance.  Psychiatric/Behavioral: The patient is not nervous/anxious.      Objective  Vitals:   03/01/18 1114  BP: 112/76  Pulse: 63  Resp: 16  SpO2: 95%  Weight: 88 lb (39.9 kg)  Height: 5\' 3"  (1.6 m)    Physical Exam    Assessment & Plan  Problem List Items Addressed This Visit      Cardiovascular and Mediastinum   Hypertension - Primary    Recent change dose of lisinopril to 5 mg. Controlled on present dose and will continue. Will check renal panel      Relevant Medications   lisinopril (PRINIVIL,ZESTRIL) 5 MG tablet   Other Relevant Orders   Renal Function Panel     Respiratory   Obstructive chronic bronchitis without exacerbation (HCC)    Followed with Dr Mayo Ao , Will continue Fluticasone/Villantreol inhaler/Spireva respimat/allbuterol inhaler.         Endocrine   Hypothyroidism    Chronic Stable Will check TSH and adjust levothyroxin dose accordingly        Other   Moderate episode of recurrent major depressive disorder (HCC)    Other Visit Diagnoses    Fatigue, unspecified type       Relevant Orders   CBC with Differential/Platelet   TSH      Meds ordered this encounter  Medications  . lisinopril (PRINIVIL,ZESTRIL) 5 MG tablet    Sig: TAKE ONE TABLET BY  MOUTH ONCE DAILY DR Lady Gary    Dispense:  90 tablet    Refill:  1      Dr. Elizabeth Sauer [facility]  03/01/18

## 2018-03-01 NOTE — Assessment & Plan Note (Signed)
Recent change dose of lisinopril to 5 mg. Controlled on present dose and will continue. Will check renal panel

## 2018-03-01 NOTE — Assessment & Plan Note (Signed)
Chronic Stable Will check TSH and adjust levothyroxin dose accordingly

## 2018-03-02 ENCOUNTER — Other Ambulatory Visit: Payer: Self-pay

## 2018-03-02 DIAGNOSIS — E039 Hypothyroidism, unspecified: Secondary | ICD-10-CM

## 2018-03-02 LAB — CBC WITH DIFFERENTIAL/PLATELET
Basophils Absolute: 0.1 10*3/uL (ref 0.0–0.2)
Basos: 1 %
EOS (ABSOLUTE): 0.2 10*3/uL (ref 0.0–0.4)
EOS: 2 %
Hematocrit: 44.6 % (ref 34.0–46.6)
Hemoglobin: 14.2 g/dL (ref 11.1–15.9)
Immature Grans (Abs): 0 10*3/uL (ref 0.0–0.1)
Immature Granulocytes: 0 %
LYMPHS ABS: 1 10*3/uL (ref 0.7–3.1)
Lymphs: 12 %
MCH: 30.7 pg (ref 26.6–33.0)
MCHC: 31.8 g/dL (ref 31.5–35.7)
MCV: 96 fL (ref 79–97)
MONOCYTES: 11 %
MONOS ABS: 0.9 10*3/uL (ref 0.1–0.9)
NEUTROS ABS: 6.1 10*3/uL (ref 1.4–7.0)
NEUTROS PCT: 74 %
Platelets: 239 10*3/uL (ref 150–450)
RBC: 4.63 x10E6/uL (ref 3.77–5.28)
RDW: 14.3 % (ref 12.3–15.4)
WBC: 8.2 10*3/uL (ref 3.4–10.8)

## 2018-03-02 LAB — RENAL FUNCTION PANEL
Albumin: 4.3 g/dL (ref 3.5–4.8)
BUN/Creatinine Ratio: 19 (ref 12–28)
BUN: 13 mg/dL (ref 8–27)
CALCIUM: 9.4 mg/dL (ref 8.7–10.3)
CO2: 25 mmol/L (ref 20–29)
Chloride: 103 mmol/L (ref 96–106)
Creatinine, Ser: 0.7 mg/dL (ref 0.57–1.00)
GFR calc Af Amer: 97 mL/min/{1.73_m2} (ref >59)
GFR, EST NON AFRICAN AMERICAN: 84 mL/min/{1.73_m2} (ref >59)
GLUCOSE: 75 mg/dL (ref 65–99)
PHOSPHORUS: 3.3 mg/dL (ref 2.5–4.5)
Potassium: 4.6 mmol/L (ref 3.5–5.2)
Sodium: 139 mmol/L (ref 134–144)

## 2018-03-02 LAB — TSH: TSH: 0.976 u[IU]/mL (ref 0.450–4.500)

## 2018-03-02 MED ORDER — LEVOTHYROXINE SODIUM 75 MCG PO TABS: 75.0000 ug | ORAL_TABLET | Freq: Every day | ORAL | 1 refills | Status: AC

## 2018-03-09 ENCOUNTER — Other Ambulatory Visit: Payer: Self-pay | Admitting: Family Medicine

## 2018-03-09 DIAGNOSIS — F331 Major depressive disorder, recurrent, moderate: Secondary | ICD-10-CM

## 2018-03-09 DIAGNOSIS — K219 Gastro-esophageal reflux disease without esophagitis: Secondary | ICD-10-CM

## 2018-03-11 DIAGNOSIS — J439 Emphysema, unspecified: Secondary | ICD-10-CM | POA: Diagnosis not present

## 2018-03-15 ENCOUNTER — Emergency Department
Admission: EM | Admit: 2018-03-15 | Discharge: 2018-03-15 | Disposition: A | Discharge status: Home / Self Care | Payer: Medicare Other | Attending: Emergency Medicine | Admitting: Emergency Medicine

## 2018-03-15 ENCOUNTER — Other Ambulatory Visit: Payer: Self-pay

## 2018-03-15 ENCOUNTER — Encounter: Payer: Self-pay | Admitting: Emergency Medicine

## 2018-03-15 ENCOUNTER — Emergency Department: Payer: Medicare Other

## 2018-03-15 ENCOUNTER — Telehealth: Payer: Self-pay

## 2018-03-15 DIAGNOSIS — J449 Chronic obstructive pulmonary disease, unspecified: Secondary | ICD-10-CM | POA: Insufficient documentation

## 2018-03-15 DIAGNOSIS — I10 Essential (primary) hypertension: Secondary | ICD-10-CM | POA: Insufficient documentation

## 2018-03-15 DIAGNOSIS — Z79899 Other long term (current) drug therapy: Secondary | ICD-10-CM | POA: Diagnosis not present

## 2018-03-15 DIAGNOSIS — Z7982 Long term (current) use of aspirin: Secondary | ICD-10-CM | POA: Insufficient documentation

## 2018-03-15 DIAGNOSIS — K469 Unspecified abdominal hernia without obstruction or gangrene: Secondary | ICD-10-CM | POA: Diagnosis not present

## 2018-03-15 DIAGNOSIS — E039 Hypothyroidism, unspecified: Secondary | ICD-10-CM | POA: Diagnosis not present

## 2018-03-15 DIAGNOSIS — Z87891 Personal history of nicotine dependence: Secondary | ICD-10-CM | POA: Insufficient documentation

## 2018-03-15 DIAGNOSIS — K439 Ventral hernia without obstruction or gangrene: Secondary | ICD-10-CM

## 2018-03-15 DIAGNOSIS — R1900 Intra-abdominal and pelvic swelling, mass and lump, unspecified site: Secondary | ICD-10-CM | POA: Diagnosis not present

## 2018-03-15 DIAGNOSIS — R1030 Lower abdominal pain, unspecified: Secondary | ICD-10-CM | POA: Diagnosis present

## 2018-03-15 LAB — CBC
HEMATOCRIT: 44.3 % (ref 35.0–47.0)
HEMOGLOBIN: 14.5 g/dL (ref 12.0–16.0)
MCH: 31.5 pg (ref 26.0–34.0)
MCHC: 32.8 g/dL (ref 32.0–36.0)
MCV: 96.1 fL (ref 80.0–100.0)
Platelets: 193 10*3/uL (ref 150–440)
RBC: 4.61 MIL/uL (ref 3.80–5.20)
RDW: 14.9 % — ABNORMAL HIGH (ref 11.5–14.5)
WBC: 11.6 10*3/uL — ABNORMAL HIGH (ref 3.6–11.0)

## 2018-03-15 LAB — COMPREHENSIVE METABOLIC PANEL
ALBUMIN: 4.4 g/dL (ref 3.5–5.0)
ALT: 18 U/L (ref 0–44)
ANION GAP: 6 (ref 5–15)
AST: 27 U/L (ref 15–41)
Alkaline Phosphatase: 64 U/L (ref 38–126)
BUN: 19 mg/dL (ref 8–23)
CHLORIDE: 106 mmol/L (ref 98–111)
CO2: 27 mmol/L (ref 22–32)
Calcium: 9.2 mg/dL (ref 8.9–10.3)
Creatinine, Ser: 0.6 mg/dL (ref 0.44–1.00)
GFR calc Af Amer: >60 mL/min (ref >60)
GFR calc non Af Amer: >60 mL/min (ref >60)
Glucose, Bld: 109 mg/dL — ABNORMAL HIGH (ref 70–99)
POTASSIUM: 4.1 mmol/L (ref 3.5–5.1)
SODIUM: 139 mmol/L (ref 135–145)
Total Bilirubin: 0.8 mg/dL (ref 0.3–1.2)
Total Protein: 7.1 g/dL (ref 6.5–8.1)

## 2018-03-15 LAB — LIPASE, BLOOD: Lipase: 32 U/L (ref 11–51)

## 2018-03-15 MED ORDER — FENTANYL CITRATE (PF) 100 MCG/2ML IJ SOLN
INTRAMUSCULAR | Status: AC
  Filled 2018-03-15: qty 2

## 2018-03-15 MED ORDER — FENTANYL CITRATE (PF) 100 MCG/2ML IJ SOLN
50.0000 ug | Freq: Once | INTRAMUSCULAR | Status: AC
  Administered 2018-03-15: 50 ug via INTRAVENOUS

## 2018-03-15 MED ORDER — FENTANYL CITRATE (PF) 100 MCG/2ML IJ SOLN
50.0000 ug | Freq: Once | INTRAMUSCULAR | Status: AC
  Administered 2018-03-15: 50 ug via INTRAVENOUS
  Filled 2018-03-15: qty 2

## 2018-03-15 NOTE — ED Provider Notes (Addendum)
[facility] Emergency Department Provider Note  ____________________________________________   I have reviewed the triage vital signs and the nursing notes. Where available I have reviewed prior notes and, if possible and indicated, outside hospital notes.    HISTORY  Chief Complaint Abdominal Pain    HPI Tasha Howe is a 77 y.o. female  Severe COPD, valvular disease depression, denies prior surgery on her abdomen presents today with a suprapubic bulge which is over the pelvic bone and very tender.  Is been there for couple hours.  Nothing makes it better nothing makes it worse, except for touching it.  No other alleviating or aggravating factors no other complaints.  No dysuria no urinary frequency caused nausea and vomited once.  No other complaints.  Patient on home oxygen, breathing at baseline per patient and family.  Pain is significant nonradiating sharp  Past Medical History:  Diagnosis Date  . Aortic anomaly    VALVE DISEASE  . COPD (chronic obstructive pulmonary disease) (HCC)    SEVERE   . Depression   . GERD (gastroesophageal reflux disease)   . Hyperlipidemia   . Hypertension   . Hypothyroidism   . Thyroid disease   . Tremors of nervous system     Patient Active Problem List   Diagnosis Date Noted  . Moderate episode of recurrent major depressive disorder (HCC) 03/01/2018  . Obstructive chronic bronchitis without exacerbation (HCC) 03/01/2018  . Centrilobular emphysema (HCC) 10/13/2016  . Acute respiratory distress 09/28/2016  . Acute bronchitis with chronic obstructive pulmonary disease (COPD) (HCC) 06/05/2016  . Hyperlipemia 04/28/2016  . CAFL (chronic airflow limitation) (HCC) 11/30/2014  . Depression 11/30/2014  . Gastroesophageal reflux disease 11/30/2014  . Hypertension 11/30/2014  . Hypothyroidism 11/30/2014    Past Surgical History:  Procedure Laterality Date  . BREAST SURGERY     CYST  . HERNIA REPAIR   08/10/1989  . KYPHOPLASTY N/A 12/03/2016   Procedure: KYPHOPLASTY L1,L3;  Surgeon: Kennedy Bucker, MD;  Location: [facility] ORS;  Service: Orthopedics;  Laterality: N/A;  . VAGINAL HYSTERECTOMY  08/10/1958  . VARICOSE VEIN SURGERY    . vascular stent  08/10/1978    Prior to Admission medications   Medication Sig Start Date End Date Taking? Authorizing Provider  acetaminophen (TYLENOL) 500 MG tablet Take 1,000 mg by mouth every 6 (six) hours as needed for mild pain.    [provider]  albuterol (PROAIR HFA) 108 (90 Base) MCG/ACT inhaler Inhale 2 puffs into the lungs every 6 (six) hours as needed for wheezing or shortness of breath.    [provider]  Ascorbic Acid (VITAMIN C) 500 MG CAPS Take 500 mg by mouth daily at 12 noon.     [provider]  aspirin 81 MG tablet Take 1 tablet by mouth daily at 12 noon.     [provider]  Calcium Carbonate-Vit D-Min (CALCIUM 600 + MINERALS PO) Take 1 tablet by mouth daily at 12 noon.     [provider]  citalopram (CELEXA) 20 MG tablet TAKE 1 TABLET BY MOUTH ONCE DAILY . APPOINTMENT REQUIRED FOR FUTURE REFILLS 03/09/18   Duanne Limerick, MD  docusate sodium (COLACE) 100 MG capsule Take 100 mg by mouth daily.    [provider]  Fluticasone Furoate-Vilanterol 100-25 MCG/INH AEPB Inhale 1 puff into the lungs daily. Dr Meredeth Ide    [provider]  ipratropium-albuterol (DUONEB) 0.5-2.5 (3) MG/3ML SOLN Take 3 mLs by nebulization every 6 (six) hours. 06/07/16  Enedina Finner, MD  levothyroxine (SYNTHROID, LEVOTHROID) 75 MCG tablet Take 1 tablet (75 mcg total) by mouth daily. 03/02/18   Duanne Limerick, MD  lisinopril (PRINIVIL,ZESTRIL) 5 MG tablet TAKE ONE TABLET BY MOUTH ONCE DAILY DR Lady Gary 03/01/18   Duanne Limerick, MD  Multiple Vitamin (MULTIVITAMIN WITH MINERALS) TABS tablet Take 1 tablet by mouth daily.    [provider]  Omega-3 Fatty Acids (FISH OIL) 1000 MG CPDR Take 2 capsules by mouth daily.  07/29/17   Duanne Limerick, MD  omeprazole (PRILOSEC) 40 MG capsule TAKE 1 CAPSULE BY MOUTH ONCE DAILY . APPOINTMENT REQUIRED FOR FUTURE REFILLS 03/09/18   Duanne Limerick, MD  SPIRIVA RESPIMAT 2.5 MCG/ACT AERS Inhale 2 puffs into the lungs daily.  10/30/16   [provider]  vitamin E 400 UNIT capsule Take 400 Units by mouth daily.    [provider]    Allergies Alendronate and Sulfa antibiotics  No family history on file.  Social History Social History   Tobacco Use  . Smoking status: Former Smoker    Types: Cigarettes    Last attempt to quit: 08/11/2003    Years since quitting: 14.6  . Smokeless tobacco: Never Used  Substance Use Topics  . Alcohol use: No    Alcohol/week: 0.0 oz  . Drug use: No    Review of Systems Constitutional: No fever/chills Eyes: No visual changes. ENT: No sore throat. No stiff neck no neck pain Cardiovascular: Denies chest pain. Respiratory: Denies shortness of breath. Gastrointestinal:   vomiting.  No diarrhea.  No constipation. Genitourinary: Negative for dysuria. Musculoskeletal: Negative lower extremity swelling Skin: Negative for rash. Neurological: Negative for severe headaches, focal weakness or numbness.   ____________________________________________   PHYSICAL EXAM:  VITAL SIGNS: ED Triage Vitals  Enc Vitals Group     BP 03/15/18 1716 (!) 140/99     Pulse Rate 03/15/18 1716 83     Resp --      Temp 03/15/18 1716 98.2 F (36.8 C)     Temp Source 03/15/18 1716 Oral     SpO2 03/15/18 1716 95 %     Weight 03/15/18 1720 88 lb (39.9 kg)     Height 03/15/18 1720 5\' 3"  (1.6 m)     Head Circumference --      Peak Flow --      Pain Score 03/15/18 1719 7     Pain Loc --      Pain Edu? --      Excl. in GC? --     Constitutional: Alert and oriented. Well appearing and in no acute distress. Eyes: Conjunctivae are normal Head: Atraumatic HEENT: No congestion/rhinnorhea. Mucous membranes are moist.  Oropharynx  non-erythematous Neck:   Nontender with no meningismus, no masses, no stridor Cardiovascular: Normal rate, regular rhythm. Grossly normal heart sounds.  Good peripheral circulation. Respiratory: Decreased somewhat in the bases, Abdominal: Soft and there is a very focal mobile non-erythematous very tender mass above the pubic symphysis above the bone actually, which is approximately 2-1/2 cm somewhat mobile and very tender.  Not red not hard not fluctuant. No distention. No guarding no rebound Back:  There is no focal tenderness or step off.  there is no midline tenderness there are no lesions noted. there is no CVA tenderness  Musculoskeletal: No lower extremity tenderness, no upper extremity tenderness. No joint effusions, no DVT signs strong distal pulses no edema Neurologic:  Normal speech and language. No gross focal neurologic deficits  are appreciated.  Skin:  Skin is warm, dry and intact. No rash noted. Psychiatric: Mood and affect are normal. Speech and behavior are normal.  ____________________________________________   LABS (all labs ordered are listed, but only abnormal results are displayed)  Labs Reviewed  LIPASE, BLOOD  COMPREHENSIVE METABOLIC PANEL  CBC  URINALYSIS, COMPLETE (UACMP) WITH MICROSCOPIC    Pertinent labs  results that were available during my care of the patient were reviewed by me and considered in my medical decision making (see chart for details). ____________________________________________  EKG  I personally interpreted any EKGs ordered by me or triage  ____________________________________________  RADIOLOGY  Pertinent labs & imaging results that were available during my care of the patient were reviewed by me and considered in my medical decision making (see chart for details). If possible, patient and/or family made aware of any abnormal findings.  No results found. ____________________________________________    PROCEDURES  Procedure(s)  performed: None  Procedures  Critical Care performed: None  ____________________________________________   INITIAL IMPRESSION / ASSESSMENT AND PLAN / ED COURSE  Pertinent labs & imaging results that were available during my care of the patient were reviewed by me and considered in my medical decision making (see chart for details).  With a very tender suprapubic mass unclear etiology, but according to patient just showed up this afternoon.  Hernia is the most likely thing although attempts to reduce it were unsuccessful with gentle palpation, but will give the patient pain medication as this may help now obtain imaging and reassess  ----------------------------------------- 6:05 PM on 03/15/2018 -----------------------------------------  CT to my exam shows a hernia from the left inguinal region that goes over the pubic bone.  I have tried to reduce this after fentanyl, patient finds it to be exquisitely tender.  Is very difficult to give her sufficient analgesia and my concern is that at 88 pounds with significant COPD on 3 L of oxygen I am going to run into some trouble if I am too aggressive with pain medication here.  Have paged surgery to evaluate the patient, waiting a call back.  ----------------------------------------- 6:44 PM on 03/15/2018 -----------------------------------------  Discussed with surgery shortly after the page, they did encourage ice, continued reverse Trendelenburg and continued manipulation which I have tried.  I have not succeeded in reducing this hernia yet.  It is somewhat indurated.  I will see if surgery can take a look at her.  ----------------------------------------- 6:59 PM on 03/15/2018 -----------------------------------------   surgeon in room appreciate consult.  ----------------------------------------- 10:53 PM on 03/15/2018 ----------------------------------------- Patient's hernia was reduced by surgery very much appreciate the  consult able to ambulate we watched her for 2 hours with no recurrence of hernia, she was able to take p.o. with no evidence of discomfort nothing to suggest ischemia, patient discharged with return precautions and follow-up given understood all of her pain away as soon as the hernia was reduced.  Family understands what to do if this happens again and they will return for new or worrisome symptoms follow-up given and understood    ____________________________________________   FINAL CLINICAL IMPRESSION(S) / ED DIAGNOSES  Final diagnoses:  None      This chart was dictated using voice recognition software.  Despite best efforts to proofread,  errors can occur which can change meaning.      Jeanmarie Plant, MD 03/15/18 1737    Jeanmarie Plant, MD 03/15/18 1806    Jeanmarie Plant, MD 03/15/18 1844    Ileana Roup  A, MD 03/15/18 [id number]    Jeanmarie PlantMcShane, Faiga Stones A, MD 03/15/18 1859    Jeanmarie PlantMcShane, Tanith Dagostino A, MD 03/15/18 2255

## 2018-03-15 NOTE — ED Notes (Signed)
Pt able to ambulate w/cane to the bathrooom. Pt ambulated with a steady gait. NAD noted. This RN assessed and palpated abdomen and no umbilical hernia note.

## 2018-03-15 NOTE — ED Triage Notes (Signed)
Pt c/o lower abdominal pain and swelling to her pubic area. Pt states pain started between 2pm-3pm today. Pt also c/o vomiting at this time. Pt states has not urinated since the pain began. Pt currently on home O2.

## 2018-03-15 NOTE — Telephone Encounter (Signed)
Pt called stating "a place has came up, almost on my kitty cat, and I'm vomitting. She has not fever, noticed the place today around 2-3:00 and states that it is "very sore." Dr. Yetta BarreJones has advised pt to go to ER as she may be septic due to vomitting. She is going to ER for further eval

## 2018-03-15 NOTE — ED Notes (Signed)
Pt was able to stand with 1X person assistance. This RN assessed and palpated her abdomen and no umbilical hernia noted. Pt denies any pain. NAD noted.

## 2018-03-15 NOTE — ED Notes (Signed)
Surgeon at bedside.  

## 2018-03-17 ENCOUNTER — Encounter: Payer: Self-pay | Admitting: Family Medicine

## 2018-03-17 ENCOUNTER — Ambulatory Visit (INDEPENDENT_AMBULATORY_CARE_PROVIDER_SITE_OTHER): Payer: Medicare Other | Admitting: Family Medicine

## 2018-03-17 VITALS — BP 120/80 | HR 68 | Ht 63.0 in | Wt 88.0 lb

## 2018-03-17 DIAGNOSIS — R110 Nausea: Secondary | ICD-10-CM | POA: Diagnosis not present

## 2018-03-17 DIAGNOSIS — K2900 Acute gastritis without bleeding: Secondary | ICD-10-CM | POA: Diagnosis not present

## 2018-03-17 DIAGNOSIS — Z8711 Personal history of peptic ulcer disease: Secondary | ICD-10-CM | POA: Diagnosis not present

## 2018-03-17 MED ORDER — SUCRALFATE 1 G PO TABS: 1.0000 g | ORAL_TABLET | Freq: Three times a day (TID) | ORAL | 1 refills | Status: AC

## 2018-03-17 NOTE — Progress Notes (Signed)
Name: Tasha Howe   MRN: [id number]    DOB: Jan 05, 1941   Date:03/17/2018       Progress Note  Subjective  Chief Complaint  Chief Complaint  Patient presents with  . Follow-up    vomitting- still nauseated. making appt today to see surgeon on 13th of Aug    Emesis   This is a recurrent problem. The current episode started in the past 7 days. The problem occurs intermittently. The problem has been waxing and waning. There has been no fever. Pertinent negatives include no abdominal pain, arthralgias, chest pain, chills, coughing, diarrhea, dizziness, fever, headaches, myalgias, sweats, URI or weight loss.    No problem-specific Assessment & Plan notes found for this encounter.   Past Medical History:  Diagnosis Date  . Aortic anomaly    VALVE DISEASE  . COPD (chronic obstructive pulmonary disease) (HCC)    SEVERE   . Depression   . GERD (gastroesophageal reflux disease)   . Hyperlipidemia   . Hypertension   . Hypothyroidism   . Thyroid disease   . Tremors of nervous system     Past Surgical History:  Procedure Laterality Date  . BREAST SURGERY     CYST  . HERNIA REPAIR  08/10/1989  . KYPHOPLASTY N/A 12/03/2016   Procedure: KYPHOPLASTY L1,L3;  Surgeon: Kennedy Bucker, MD;  Location: [facility] ORS;  Service: Orthopedics;  Laterality: N/A;  . VAGINAL HYSTERECTOMY  08/10/1958  . VARICOSE VEIN SURGERY    . vascular stent  08/10/1978    No family history on file.  Social History   Socioeconomic History  . Marital status: Divorced    Spouse name: Not on file  . Number of children: Not on file  . Years of education: Not on file  . Highest education level: Not on file  Occupational History  . Not on file  Social Needs  . Financial resource strain: Not on file  . Food insecurity:    Worry: Not on file    Inability: Not on file  . Transportation needs:    Medical: Not on file    Non-medical: Not on file  Tobacco Use  . Smoking status: Former Smoker    Types: Cigarettes     Last attempt to quit: 08/11/2003    Years since quitting: 14.6  . Smokeless tobacco: Never Used  Substance and Sexual Activity  . Alcohol use: No    Alcohol/week: 0.0 standard drinks  . Drug use: No  . Sexual activity: Not Currently  Lifestyle  . Physical activity:    Days per week: Not on file    Minutes per session: Not on file  . Stress: Not on file  Relationships  . Social connections:    Talks on phone: Not on file    Gets together: Not on file    Attends religious service: Not on file    Active member of club or organization: Not on file    Attends meetings of clubs or organizations: Not on file    Relationship status: Not on file  . Intimate partner violence:    Fear of current or ex partner: Not on file    Emotionally abused: Not on file    Physically abused: Not on file    Forced sexual activity: Not on file  Other Topics Concern  . Not on file  Social History Narrative  . Not on file    Allergies  Allergen Reactions  . Alendronate Other (See Comments)  Other reaction(s): Other (See Comments) GI Intolerance GI Intolerance  . Sulfa Antibiotics Hives    Outpatient Medications Prior to Visit  Medication Sig Dispense Refill  . acetaminophen (TYLENOL) 500 MG tablet Take 1,000 mg by mouth every 6 (six) hours as needed for mild pain.    Marland Kitchen albuterol (PROAIR HFA) 108 (90 Base) MCG/ACT inhaler Inhale 2 puffs into the lungs every 6 (six) hours as needed for wheezing or shortness of breath.    . Ascorbic Acid (VITAMIN C) 500 MG CAPS Take 500 mg by mouth daily at 12 noon.     Marland Kitchen aspirin 81 MG tablet Take 1 tablet by mouth daily at 12 noon.     . Calcium Carbonate-Vit D-Min (CALCIUM 600 + MINERALS PO) Take 1 tablet by mouth daily at 12 noon.     . citalopram (CELEXA) 20 MG tablet TAKE 1 TABLET BY MOUTH ONCE DAILY . APPOINTMENT REQUIRED FOR FUTURE REFILLS 90 tablet 1  . docusate sodium (COLACE) 100 MG capsule Take 100 mg by mouth daily.    . Fluticasone  Furoate-Vilanterol 100-25 MCG/INH AEPB Inhale 1 puff into the lungs daily. Dr Meredeth Ide    . ipratropium-albuterol (DUONEB) 0.5-2.5 (3) MG/3ML SOLN Take 3 mLs by nebulization every 6 (six) hours. 360 mL 0  . levothyroxine (SYNTHROID, LEVOTHROID) 75 MCG tablet Take 1 tablet (75 mcg total) by mouth daily. 90 tablet 1  . lisinopril (PRINIVIL,ZESTRIL) 5 MG tablet TAKE ONE TABLET BY MOUTH ONCE DAILY DR FATH 90 tablet 1  . Multiple Vitamin (MULTIVITAMIN WITH MINERALS) TABS tablet Take 1 tablet by mouth daily.    . Omega-3 Fatty Acids (FISH OIL) 1000 MG CPDR Take 2 capsules by mouth daily.    Marland Kitchen omeprazole (PRILOSEC) 40 MG capsule TAKE 1 CAPSULE BY MOUTH ONCE DAILY . APPOINTMENT REQUIRED FOR FUTURE REFILLS 90 capsule 1  . SPIRIVA RESPIMAT 2.5 MCG/ACT AERS Inhale 2 puffs into the lungs daily.     . vitamin E 400 UNIT capsule Take 400 Units by mouth daily.     No facility-administered medications prior to visit.     Review of Systems  Constitutional: Negative for chills, fever, malaise/fatigue and weight loss.  HENT: Negative for ear discharge, ear pain and sore throat.   Eyes: Negative for blurred vision.  Respiratory: Negative for cough, sputum production, shortness of breath and wheezing.   Cardiovascular: Negative for chest pain, palpitations and leg swelling.  Gastrointestinal: Positive for vomiting. Negative for abdominal pain, blood in stool, constipation, diarrhea, heartburn, melena and nausea.  Genitourinary: Negative for dysuria, frequency, hematuria and urgency.  Musculoskeletal: Negative for arthralgias, back pain, joint pain, myalgias and neck pain.  Skin: Negative for rash.  Neurological: Negative for dizziness, tingling, sensory change, focal weakness and headaches.  Endo/Heme/Allergies: Negative for environmental allergies and polydipsia. Does not bruise/bleed easily.  Psychiatric/Behavioral: Negative for depression and suicidal ideas. The patient is not nervous/anxious and does not have  insomnia.      Objective  Vitals:   03/17/18 1045  BP: 120/80  Pulse: 68  Weight: 88 lb (39.9 kg)  Height: 5\' 3"  (1.6 m)    Physical Exam  Constitutional: She is oriented to person, place, and time. She appears well-developed and well-nourished.  HENT:  Head: Normocephalic.  Right Ear: External ear normal.  Left Ear: External ear normal.  Mouth/Throat: Oropharynx is clear and moist.  Eyes: Pupils are equal, round, and reactive to light. Conjunctivae and EOM are normal. Lids are everted and swept, no foreign bodies found.  Left eye exhibits no hordeolum. No foreign body present in the left eye. Right conjunctiva is not injected. Left conjunctiva is not injected. No scleral icterus.  Neck: Normal range of motion. Neck supple. No JVD present. No tracheal deviation present. No thyromegaly present.  Cardiovascular: Normal rate, regular rhythm, normal heart sounds and intact distal pulses. Exam reveals no gallop and no friction rub.  No murmur heard. Pulmonary/Chest: Effort normal and breath sounds normal. No respiratory distress. She has no wheezes. She has no rales.  Abdominal: Soft. Bowel sounds are normal. She exhibits no mass. There is no hepatosplenomegaly. There is tenderness in the suprapubic area. There is no rebound and no guarding.  Musculoskeletal: Normal range of motion. She exhibits no edema or tenderness.  Lymphadenopathy:    She has no cervical adenopathy.  Neurological: She is alert and oriented to person, place, and time. She has normal strength. She displays normal reflexes. No cranial nerve deficit.  Skin: Skin is warm. No rash noted.  Psychiatric: She has a normal mood and affect. Her mood appears not anxious. She does not exhibit a depressed mood.  Nursing note and vitals reviewed.     Assessment & Plan  Problem List Items Addressed This Visit    None    Visit Diagnoses    Acute gastritis without hemorrhage, unspecified gastritis type    -  Primary    Currently on nexium 40 mg for reflux. Review noted hx of PUD. Initiate Sucrafate tid for persistent nausea.   History of peptic ulcer disease       Positive h pylori 2015   Nausea       improved. Will continue present regimen      Meds ordered this encounter  Medications  . sucralfate (CARAFATE) 1 g tablet    Sig: Take 1 tablet (1 g total) by mouth 4 (four) times daily -  with meals and at bedtime.    Dispense:  60 tablet    Refill:  1      Dr. Elizabeth Sauereanna Jones [facility]  03/17/18

## 2018-03-21 DIAGNOSIS — J449 Chronic obstructive pulmonary disease, unspecified: Secondary | ICD-10-CM | POA: Diagnosis not present

## 2018-03-22 DIAGNOSIS — K4090 Unilateral inguinal hernia, without obstruction or gangrene, not specified as recurrent: Secondary | ICD-10-CM | POA: Diagnosis not present

## 2018-03-30 DIAGNOSIS — Z9981 Dependence on supplemental oxygen: Secondary | ICD-10-CM | POA: Diagnosis not present

## 2018-03-30 DIAGNOSIS — R0609 Other forms of dyspnea: Secondary | ICD-10-CM | POA: Diagnosis not present

## 2018-03-30 DIAGNOSIS — J449 Chronic obstructive pulmonary disease, unspecified: Secondary | ICD-10-CM | POA: Diagnosis not present

## 2018-04-18 DIAGNOSIS — J438 Other emphysema: Secondary | ICD-10-CM | POA: Diagnosis not present

## 2018-04-18 DIAGNOSIS — I10 Essential (primary) hypertension: Secondary | ICD-10-CM | POA: Diagnosis not present

## 2018-04-18 DIAGNOSIS — I359 Nonrheumatic aortic valve disorder, unspecified: Secondary | ICD-10-CM | POA: Diagnosis not present

## 2018-04-21 DIAGNOSIS — J449 Chronic obstructive pulmonary disease, unspecified: Secondary | ICD-10-CM | POA: Diagnosis not present

## 2018-05-09 DIAGNOSIS — J439 Emphysema, unspecified: Secondary | ICD-10-CM | POA: Diagnosis not present

## 2018-06-10 DEATH — deceased

## 2018-09-12 NOTE — Telephone Encounter (Signed)
closing encounter

## 2019-03-18 IMAGING — CT CT ABD-PELV W/O CM
2 of 4 series · 17 of 46 positions shown, 19 images · non-contrast
Comparison: 05/31/2006

CLINICAL DATA: Painful suprapubic mass with concern for hernia.

EXAM:
CT ABDOMEN AND PELVIS WITHOUT CONTRAST
TECHNIQUE: Multidetector CT imaging of the abdomen and pelvis was performed
following the standard protocol without IV contrast.

[Series 2: routine abd/pel wo · axial · 0.67mm/px · z∈[-473,-173]mm · 14 of 66 slices shown, 16 images]
[im 3/66  soft-tissue]
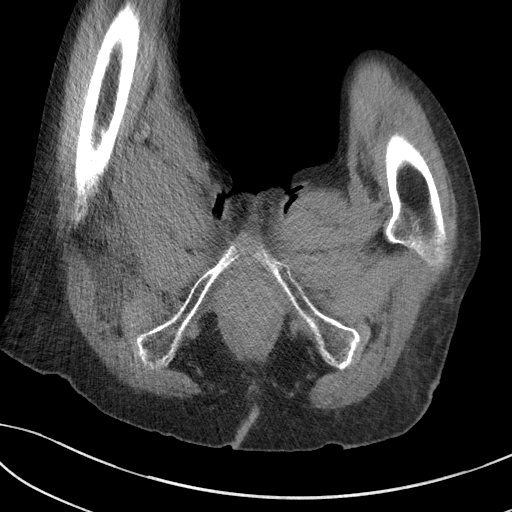
[im 3/66  bone]
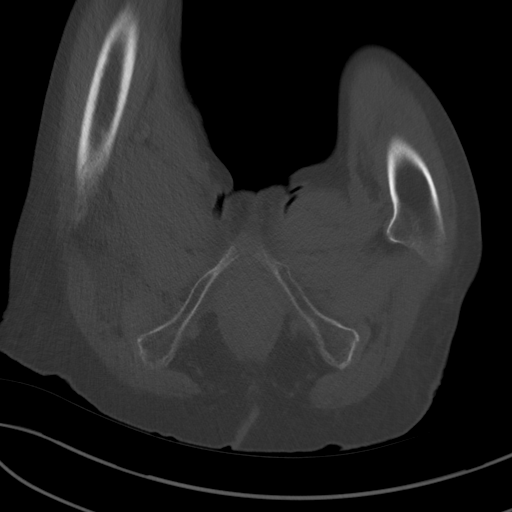
[im 9/66  soft-tissue]
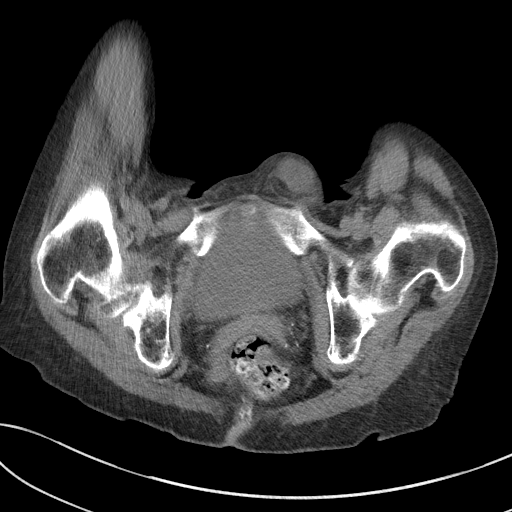
[im 14/66  soft-tissue]
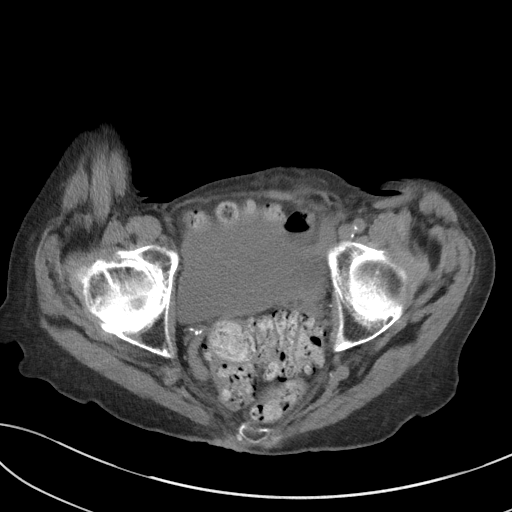
[im 17/66  soft-tissue]
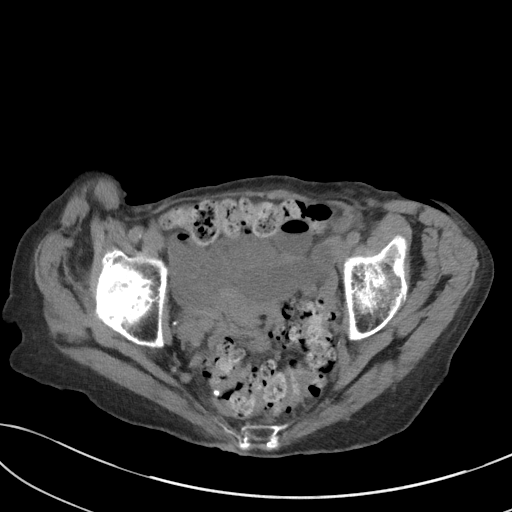
[im 22/66  soft-tissue]
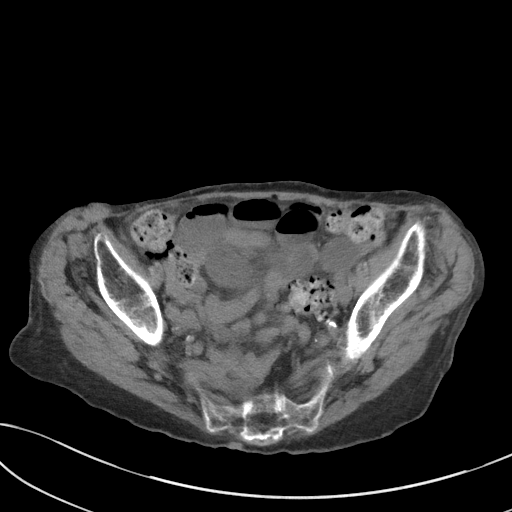
[im 28/66  soft-tissue]
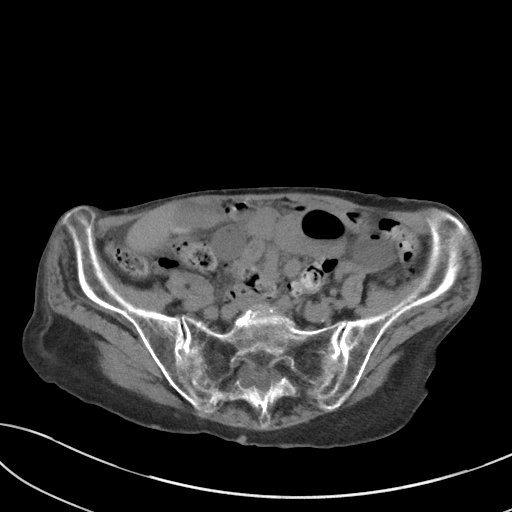
[im 30/66  soft-tissue]
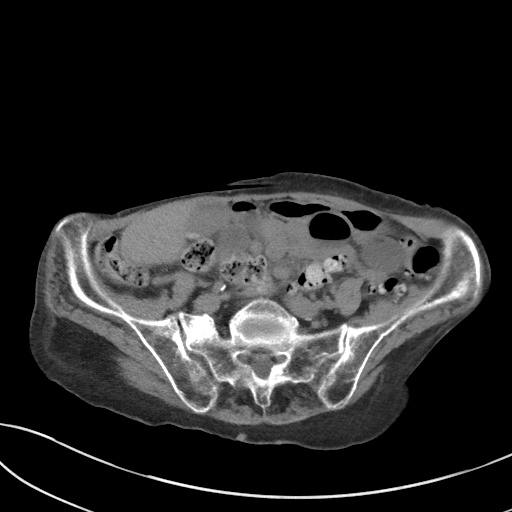
[im 36/66  soft-tissue]
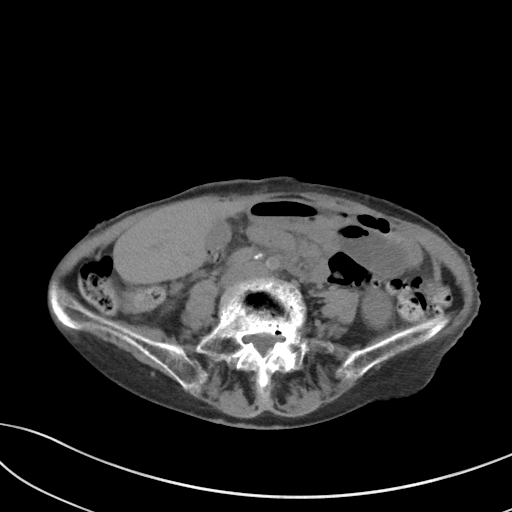
[im 38/66  soft-tissue]
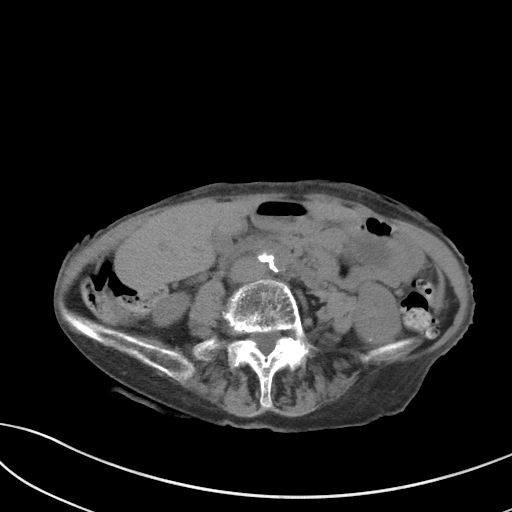
[im 38/66  bone]
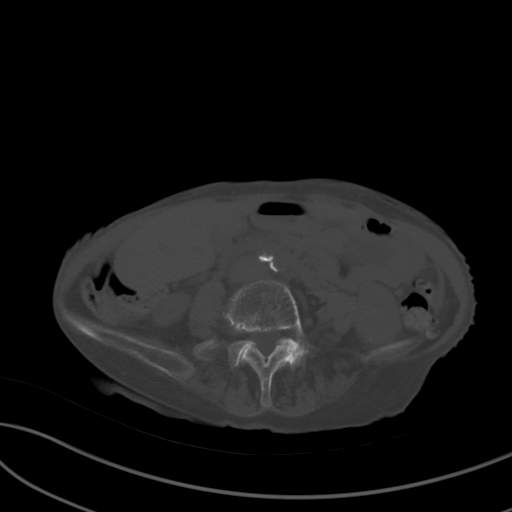
[im 44/66  soft-tissue]
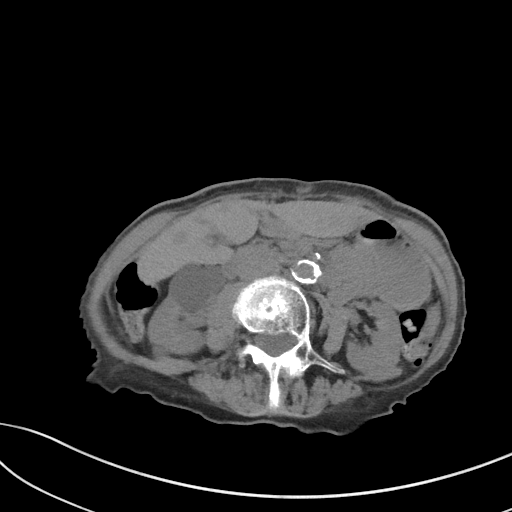
[im 49/66  soft-tissue]
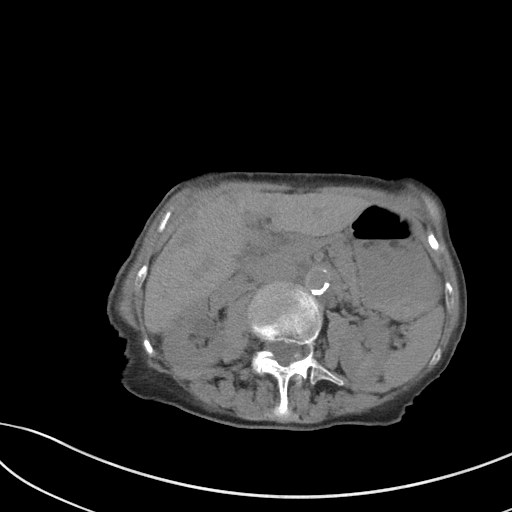
[im 52/66  soft-tissue]
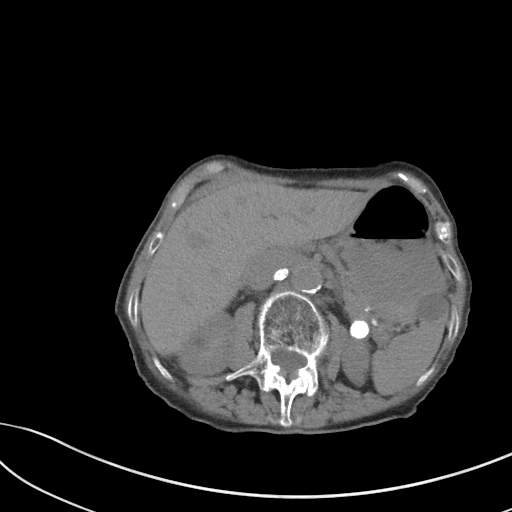
[im 57/66  soft-tissue]
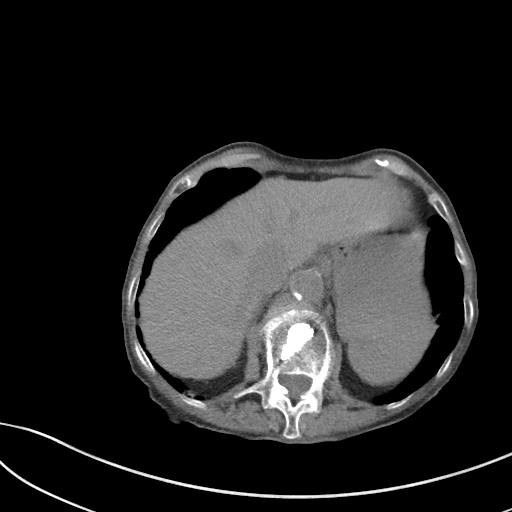
[im 63/66  soft-tissue]
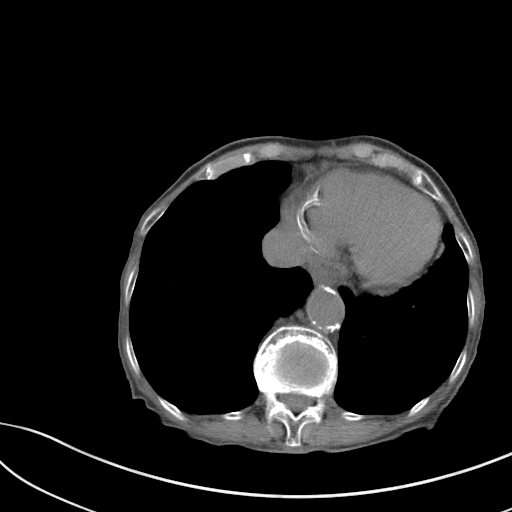

[Series 5: coronal st · coronal · 0.61mm/px · 3 of 68 slices shown]
[im 23/68  soft-tissue]
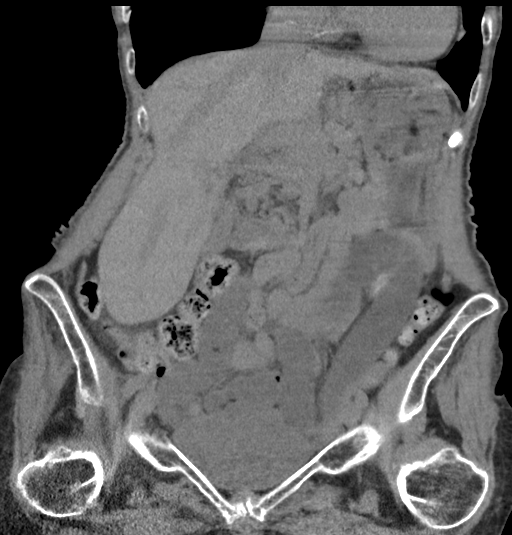
[im 30/68  soft-tissue]
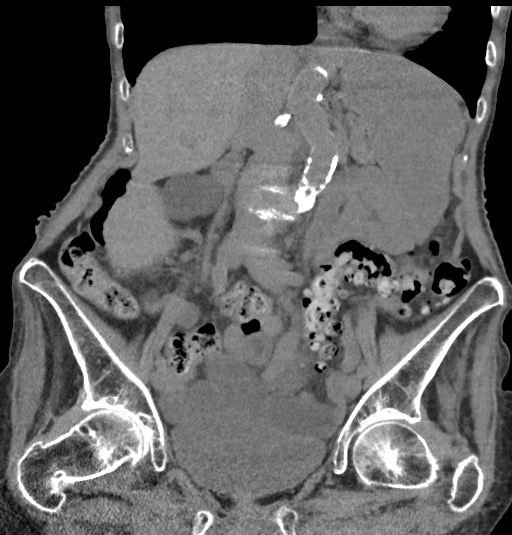
[im 38/68  soft-tissue]
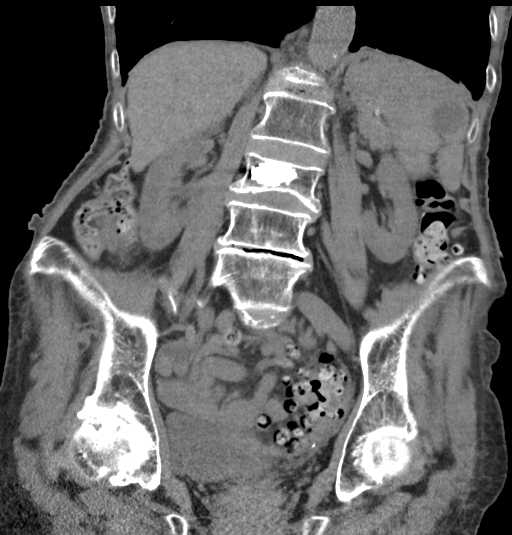

[17 of 46 positions shown; findings below may reference images not displayed]

FINDINGS: Lower chest: No acute finding. Emphysema and atherosclerosis of the
aorta and coronaries.

Hepatobiliary: No focal liver abnormality.Cholelithiasis.

Pancreas: Unremarkable.

Spleen: Cystic density along the anterior aspect of the spleen,
nonspecific but non worrisome. Heavily calcified presumed aneurysm
of the splenic artery measuring 8 mm centrally, stable from prior.
This is considered incidental based on size and patient age.

Adrenals/Urinary Tract: Negative adrenals. Bilateral renal cystic
densities. No hydronephrosis. Unremarkable bladder.

Stomach/Bowel: There is a left groin hernia, apparently inguinal,
containing a loop of small bowel. Proximal small bowel loops are
fluid-filled, but bowel dilatation is not reach the proximal
jejunum. No extraluminal gas is seen within the hernia sac. There is
extensive distal colonic diverticulosis.

Vascular/Lymphatic: Extensive atherosclerotic calcification. No
visible adenopathy

Reproductive:Hysterectomy.

Other: No ascites or pneumoperitoneum.

Musculoskeletal: Advanced right hip osteoarthritis. Osteopenia with
remote T12, L1, and L3 cement augmentation.
IMPRESSION: 1. The mass reflects a left groin (likely inguinal) narrow necked
hernia containing small bowel. There is early small-bowel
obstruction above the herniated segment.
2. Multiple chronic findings are described above.
